# Patient Record
Sex: Female | Born: 1945 | Race: White | Hispanic: No | Smoking: Never smoker
Health system: Southern US, Community
[De-identification: ages and names within clinical notes are randomized; demographics above are authoritative.]

## PROBLEM LIST (undated history)

## (undated) DIAGNOSIS — L0390 Cellulitis, unspecified: Secondary | ICD-10-CM

## (undated) DIAGNOSIS — I2510 Atherosclerotic heart disease of native coronary artery without angina pectoris: Secondary | ICD-10-CM

## (undated) DIAGNOSIS — E119 Type 2 diabetes mellitus without complications: Secondary | ICD-10-CM

## (undated) DIAGNOSIS — G4733 Obstructive sleep apnea (adult) (pediatric): Secondary | ICD-10-CM

## (undated) DIAGNOSIS — I10 Essential (primary) hypertension: Secondary | ICD-10-CM

## (undated) DIAGNOSIS — C44509 Unspecified malignant neoplasm of skin of other part of trunk: Secondary | ICD-10-CM

## (undated) DIAGNOSIS — K649 Unspecified hemorrhoids: Secondary | ICD-10-CM

## (undated) DIAGNOSIS — K219 Gastro-esophageal reflux disease without esophagitis: Secondary | ICD-10-CM

## (undated) DIAGNOSIS — E785 Hyperlipidemia, unspecified: Secondary | ICD-10-CM

## (undated) HISTORY — DX: Atherosclerotic heart disease of native coronary artery without angina pectoris: I25.10

## (undated) HISTORY — DX: Essential (primary) hypertension: I10

## (undated) HISTORY — DX: Obstructive sleep apnea (adult) (pediatric): G47.33

## (undated) HISTORY — DX: Cellulitis, unspecified: L03.90

## (undated) HISTORY — DX: Hyperlipidemia, unspecified: E78.5

## (undated) HISTORY — DX: Morbid (severe) obesity due to excess calories: E66.01

## (undated) HISTORY — DX: Gastro-esophageal reflux disease without esophagitis: K21.9

## (undated) HISTORY — DX: Type 2 diabetes mellitus without complications: E11.9

---
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
## 1994-05-02 HISTORY — PX: LAPAROSCOPIC CHOLECYSTECTOMY: SUR755

## 2004-10-02 HISTORY — PX: CORONARY ANGIOPLASTY: SHX604

## 2004-10-02 HISTORY — PX: KNEE ARTHROSCOPY: SHX127

## 2004-11-21 ENCOUNTER — Ambulatory Visit: Admission: RE | Admit: 2004-11-21 | Discharge: 2004-11-21 | Payer: Self-pay | Admitting: Cardiology

## 2004-11-21 ENCOUNTER — Inpatient Hospital Stay (HOSPITAL_COMMUNITY): Admission: EM | Admit: 2004-11-21 | Discharge: 2004-11-25 | Payer: Self-pay | Admitting: *Deleted

## 2004-11-21 IMAGING — CR DG CHEST 1V PORT
1 series · 1 of 1 positions shown · non-contrast
Comparison: none

CLINICAL DATA: Chest pain. 
 CHEST PORTABLE 1 VIEW ? [DATE]: 
 AP view of the chest dated [DATE] is reviewed without prior films for comparison.   The heart is thought to be within the upper limits of normal in size.  The pulmonary vascularity is within normal limits and the lung fields are clear.

[view not recorded]
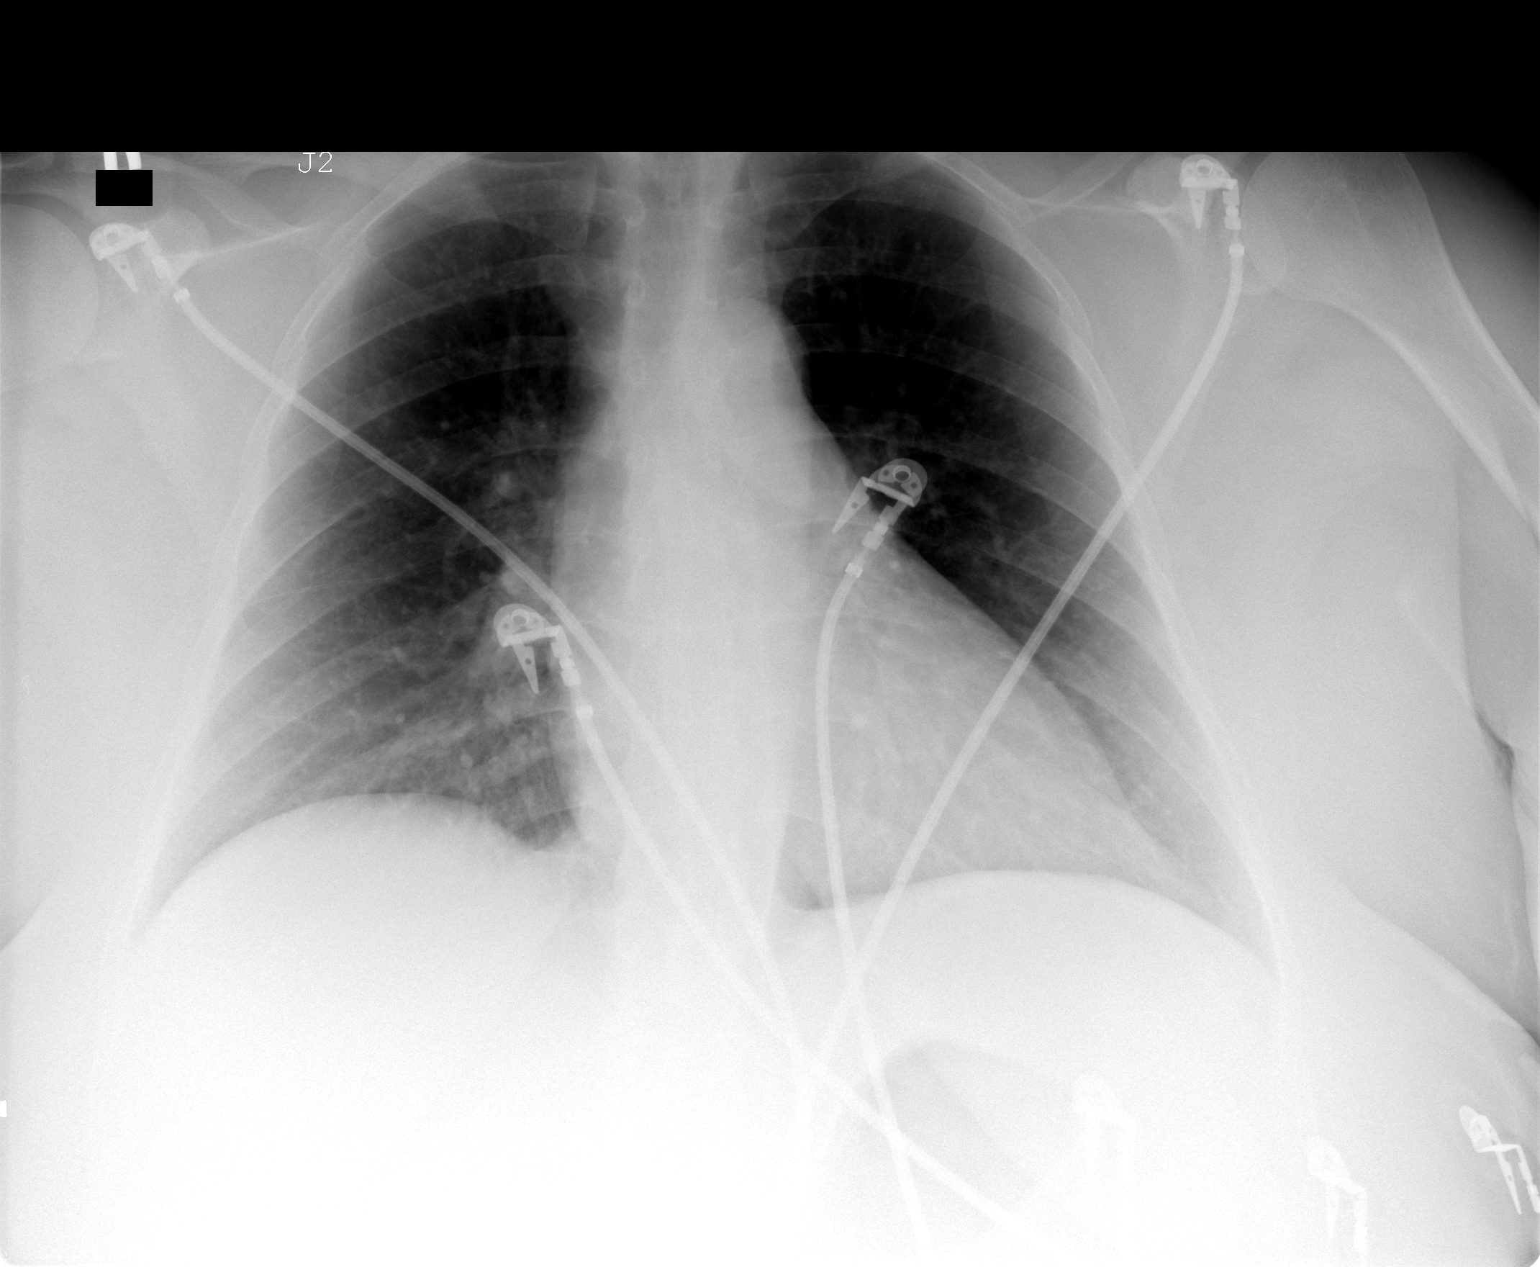

[1 of 1 positions shown; findings below may reference images not displayed]

IMPRESSION: Negative chest for active disease.

## 2004-12-12 ENCOUNTER — Encounter (HOSPITAL_COMMUNITY): Admission: RE | Admit: 2004-12-12 | Discharge: 2005-03-12 | Payer: Self-pay | Admitting: Cardiology

## 2006-01-19 ENCOUNTER — Ambulatory Visit (HOSPITAL_COMMUNITY): Admission: RE | Admit: 2006-01-19 | Discharge: 2006-01-19 | Payer: Self-pay | Admitting: Cardiology

## 2013-03-24 ENCOUNTER — Ambulatory Visit (INDEPENDENT_AMBULATORY_CARE_PROVIDER_SITE_OTHER): Payer: BC Managed Care – PPO | Admitting: Cardiovascular Disease

## 2013-03-24 ENCOUNTER — Encounter: Payer: Self-pay | Admitting: Cardiovascular Disease

## 2013-03-24 VITALS — BP 146/70 | HR 81 | Ht 64.0 in | Wt 284.0 lb

## 2013-03-24 DIAGNOSIS — I2510 Atherosclerotic heart disease of native coronary artery without angina pectoris: Secondary | ICD-10-CM | POA: Insufficient documentation

## 2013-03-24 DIAGNOSIS — I10 Essential (primary) hypertension: Secondary | ICD-10-CM | POA: Insufficient documentation

## 2013-03-24 DIAGNOSIS — G4733 Obstructive sleep apnea (adult) (pediatric): Secondary | ICD-10-CM | POA: Insufficient documentation

## 2013-03-24 DIAGNOSIS — E785 Hyperlipidemia, unspecified: Secondary | ICD-10-CM

## 2013-03-24 DIAGNOSIS — E119 Type 2 diabetes mellitus without complications: Secondary | ICD-10-CM

## 2013-03-24 NOTE — Assessment & Plan Note (Signed)
Status post LAD stenting by Dr. Lavonne Chick in 2006. She was recathed 4/07 revealing a patent stent but otherwise no significant CAD and normal LV function. Her last Myoview performed 09/15/09 was nonischemic. She gets rare chest pain.

## 2013-03-24 NOTE — Progress Notes (Signed)
03/24/2013 Deborah Barber   09-Dec-1945  [id number]  Primary Physician Deborah Spates, MD Primary Cardiologist: Deborah Gess MD Deborah Barber   HPI:  The patient is a 67 year old severely overweight divorced Caucasian female, mother of 1, grandmother to 2 grandchildren, who I last saw a year ago. She has a history of proximal LAD stenting by Dr. Lavonne Barber in 2006 with restudy April 2007 revealing a patent stent, and otherwise no significant CAD and normal LV function. Her risk factors include non-insulin-requiring diabetes, hypertension, hyperlipidemia, and a strong family history for heart disease with a father who had an MI at age 51 and multiple MIs thereafter, ultimately leading to his demise. She does have obstructive sleep apnea, on CPAP. She has atypical chest pain. Myoview performed September 15, 2009, was low risk. Recent lipid profile performed by Dr. Karleen Barber revealed a total cholesterol of 129, LDL of 41, HDL of 71. She continues to have atypical in frequent substernal chest pain.      Current Outpatient Prescriptions  Medication Sig Dispense Refill  . alendronate (FOSAMAX) 70 MG tablet Take 70 mg by mouth every 7 (seven) days.      [name] amLODipine-benazepril (LOTREL) 10-40 MG per capsule Take 1 capsule by mouth daily.       [name] aspirin 81 MG tablet Take 81 mg by mouth daily.      . cholecalciferol (VITAMIN D) 1000 UNITS tablet Take 1,000 Units by mouth. 2 tabs in the morning and 1 tab at night      . clopidogrel (PLAVIX) 75 MG tablet Take 75 mg by mouth daily.      . furosemide (LASIX) 20 MG tablet Take 20 mg by mouth as needed.      . metFORMIN (GLUCOPHAGE) 500 MG tablet Take 500 mg by mouth 2 (two) times daily.      . metoprolol succinate (TOPROL-XL) 100 MG 24 hr tablet Take 1 tablet by mouth daily.      . pantoprazole (PROTONIX) 40 MG tablet Take 40 mg by mouth daily.      . potassium chloride (K-DUR,KLOR-CON) 10 MEQ tablet Take 10 mEq by mouth as needed.      .  triamcinolone cream (KENALOG) 0.1 % Apply 1 application topically as needed.      [name] VYTORIN 10-40 MG per tablet Take 1 tablet by mouth daily.       No current facility-administered medications for this visit.    Allergies  Allergen Reactions  . Penicillins   . Sulfa Antibiotics     History   Social History  . Marital Status: Divorced    Spouse Name: N/A    Number of Children: N/A  . Years of Education: N/A   Occupational History  . Not on file.   Social History Main Topics  . Smoking status: Never Smoker   . Smokeless tobacco: Not on file  . Alcohol Use: No  . Drug Use: Not on file  . Sexually Active: Not on file   Other Topics Concern  . Not on file   Social History Narrative  . No narrative on file     Review of Systems: General: negative for chills, fever, night sweats or weight changes.  Cardiovascular: negative for chest pain, dyspnea on exertion, edema, orthopnea, palpitations, paroxysmal nocturnal dyspnea or shortness of breath Dermatological: negative for rash Respiratory: negative for cough or wheezing Urologic: negative for hematuria Abdominal: negative for nausea, vomiting, diarrhea, bright red blood per rectum, melena, or hematemesis Neurologic: negative  for visual changes, syncope, or dizziness All other systems reviewed and are otherwise negative except as noted above.    Blood pressure 146/70, pulse 81, height 5\' 4"  (1.626 m), weight 284 lb (128.822 kg).  General appearance: alert and no distress Neck: no adenopathy, no carotid bruit, no JVD, supple, symmetrical, trachea midline and thyroid not enlarged, symmetric, no tenderness/mass/nodules Lungs: clear to auscultation bilaterally Heart: regular rate and rhythm, S1, S2 normal, no murmur, click, rub or gallop Extremities: 2-3+ pitting bilateral lotion edema which is chronic probably related to venous stasis  EKG normal sinus rhythm at 81 without ST or T wave changes  ASSESSMENT AND PLAN:    Coronary artery disease Status post LAD stenting by Dr. Lavonne Barber in 2006. She was recathed 4/07 revealing a patent stent but otherwise no significant CAD and normal LV function. Her last Myoview performed 09/15/09 was nonischemic. She gets rare chest pain.  Hyperlipidemia Followed by her primary care physician on a statin drug      Deborah Gess MD [facility], [facility] 03/24/2013 10:43 AM

## 2013-03-24 NOTE — Assessment & Plan Note (Signed)
Followed by her primary care physician on a statin drug

## 2013-03-24 NOTE — Patient Instructions (Addendum)
Your physician wants you to follow-up in:  12 months.  You will receive a reminder letter in the mail two months in advance. If you don't receive a letter, please call our office to schedule the follow-up appointment.   

## 2014-01-05 ENCOUNTER — Encounter: Payer: Self-pay | Admitting: *Deleted

## 2014-01-06 ENCOUNTER — Ambulatory Visit (INDEPENDENT_AMBULATORY_CARE_PROVIDER_SITE_OTHER): Payer: BC Managed Care – PPO | Admitting: Cardiology

## 2014-01-06 ENCOUNTER — Encounter: Payer: Self-pay | Admitting: Cardiology

## 2014-01-06 VITALS — BP 142/74 | HR 81 | Ht 64.0 in | Wt 274.4 lb

## 2014-01-06 DIAGNOSIS — G4733 Obstructive sleep apnea (adult) (pediatric): Secondary | ICD-10-CM

## 2014-01-06 DIAGNOSIS — I10 Essential (primary) hypertension: Secondary | ICD-10-CM

## 2014-01-06 DIAGNOSIS — E119 Type 2 diabetes mellitus without complications: Secondary | ICD-10-CM

## 2014-01-06 DIAGNOSIS — E785 Hyperlipidemia, unspecified: Secondary | ICD-10-CM

## 2014-01-06 DIAGNOSIS — Z01810 Encounter for preprocedural cardiovascular examination: Secondary | ICD-10-CM

## 2014-01-06 DIAGNOSIS — Z01818 Encounter for other preprocedural examination: Secondary | ICD-10-CM

## 2014-01-06 DIAGNOSIS — I2510 Atherosclerotic heart disease of native coronary artery without angina pectoris: Secondary | ICD-10-CM

## 2014-01-06 NOTE — Progress Notes (Signed)
01/07/2014   PCP: Merian Capron, MD   Chief Complaint  Patient presents with  . Cardiac Clearance    Knee Surgery with Dr Hulen Luster yet scheduled.  Deborah Barber    C/o lightheadedness/dizziness-relates it to medication    Primary Cardiologist:  Dr. Gwenlyn Found  HPI:  68 year old severely overweight divorced Caucasian female, mother of 28, grandmother to 2 grandchildren, who is here for surgical clearance for Lt knee surgery. She has a history of proximal LAD stenting by Dr. Janene Madeira in 2006 with restudy April 2007 revealing a patent stent, and otherwise no significant CAD and normal LV function. Her risk factors include non-insulin-requiring diabetes, hypertension, hyperlipidemia, and a strong family history for heart disease with a father who had an MI at age 58 and multiple MIs thereafter, ultimately leading to his demise. She does have obstructive sleep apnea, on CPAP. She has atypical chest pain. Myoview performed September 15, 2009, was low risk. Recent lipid profile performed by Dr. Frederico Hamman revealed a total cholesterol of 129, LDL of 41, HDL of 71. She continues to have atypical in frequent substernal chest pain.  She also has some indigestion type discomfort after meals.  Recent lt knee pain, requiring pain meds and most likely knee surgery, was seen by Dr Aurea Graff PA and instructed to have cardiac clearance.  She has obvious pain in Lt knee in office.  With her strong family history and diabetes, hyperlipidemia we will proceed with lexiscan myoview to clear her.  It has been 5 years since last stress test.     Allergies  Allergen Reactions  . Penicillins   . Sulfa Antibiotics     Current Outpatient Prescriptions  Medication Sig Dispense Refill  . amLODipine-benazepril (LOTREL) 10-40 MG per capsule Take 1 capsule by mouth daily.       [name] aspirin 81 MG tablet Take 81 mg by mouth daily.      . cholecalciferol (VITAMIN D) 1000 UNITS tablet Take 1,000 Units by mouth. 2 tabs in  the morning and 1 tab at night      . furosemide (LASIX) 20 MG tablet Take 20 mg by mouth as needed.      [name] HYDROcodone-acetaminophen (NORCO/VICODIN) 5-325 MG per tablet Take 1 tablet by mouth every 6 (six) hours as needed.      . metFORMIN (GLUCOPHAGE) 500 MG tablet Take 500 mg by mouth 2 (two) times daily.      . metoprolol succinate (TOPROL-XL) 100 MG 24 hr tablet Take 1 tablet by mouth daily.      [name] NITROSTAT 0.4 MG SL tablet Place 1 tablet under the tongue every 5 (five) minutes x 3 doses as needed.      . ondansetron (ZOFRAN-ODT) 4 MG disintegrating tablet Take 1 tablet by mouth every 8 (eight) hours as needed.      . pantoprazole (PROTONIX) 40 MG tablet Take 40 mg by mouth daily.      . potassium chloride (K-DUR,KLOR-CON) 10 MEQ tablet Take 10 mEq by mouth as needed.      . triamcinolone cream (KENALOG) 0.1 % Apply 1 application topically as needed.      [name] VYTORIN 10-40 MG per tablet Take 1 tablet by mouth daily.      [name] alendronate (FOSAMAX) 70 MG tablet Take 70 mg by mouth every 7 (seven) days.      . clopidogrel (PLAVIX) 75 MG tablet Take 75 mg by mouth daily.  No current facility-administered medications for this visit.    Past Medical History  Diagnosis Date  . Coronary artery disease     status post LAD stenting by Dr. [name] in 2006  . Type 2 diabetes mellitus   . Hypertension   . Hyperlipidemia   . Obstructive sleep apnea     on CPAP  . Hx of echocardiogram 05/2009    was excellent with normal LV function   . History of stress test 08/201    was normal    Past Surgical History  Procedure Laterality Date  . Stents  2006    By Dr Janene Madeira revealing a patent stent, and otherwise no significant CAD and normal LV function.    TXM:IWOEHOZ:YY colds or fevers, no weight changes Skin:no rashes or ulcers HEENT:no blurred vision, no congestion CV:see HPI PUL:see HPI GI:no diarrhea constipation or melena, occ indigestion- after meals GU:no hematuria, no  dysuria MS:+ Lt knee joint pain needs surgery,  no claudication Neuro:no syncope, no lightheadedness Endo:+ diabetes followed by PCP HgbA1c 7.1, no thyroid disease  PHYSICAL EXAM BP 142/74  Pulse 81  Ht 5\' 4"  (1.626 m)  Wt 274 lb 6.4 oz (124.467 kg)  BMI 47.08 kg/m2 General:Pleasant affect, NAD Skin:Warm and dry, brisk capillary refill HEENT:normocephalic, sclera clear, mucus membranes moist Neck:supple, no JVD, no bruits, no adenopathy  Heart:S1S2 RRR without murmur, gallup, rub or click Lungs:clear without rales, rhonchi, or wheezes QMG:NOIBB, soft, non tender, + BS, do not palpate liver spleen or masses Ext:no lower ext edema, 2+ pedal pulses, 2+ radial pulses Neuro:alert and oriented, MAE, follows commands, + facial symmetry  EKG:SR, no acute changes.  ASSESSMENT AND PLAN Coronary artery disease History of proximal LAD stenting by Dr. Janene Madeira in 2006 with restudy April 2007 revealing a patent stent-- will proceed with lexiscan myoview, unable to use treadmill due to knee pain.  She has occ "indigestions" so we will rule out further CAD.    Encounter for pre-operative cardiovascular clearance lexiscan myoview, if negative we will clear for surgery, if positive will need follow up with Dr. Gwenlyn Found and poss. cath.  Essential hypertension controlled  Obstructive sleep apnea Using c pap  Hyperlipidemia Controlled and treated  Type 2 diabetes mellitus Followed by PCP

## 2014-01-06 NOTE — Patient Instructions (Signed)
We will schedule lexiscan myoview for surgical clearance for your knee surgery  EKG was good.  If Lexiscan stress test in normal we will clear for surgery, if not normal we will call and have you seen by Dr. Gwenlyn Found.  Otherwise see Dr. Gwenlyn Found in 6 months, we will count this as 6 month visit.

## 2014-01-07 ENCOUNTER — Encounter: Payer: Self-pay | Admitting: Cardiology

## 2014-01-07 DIAGNOSIS — Z01810 Encounter for preprocedural cardiovascular examination: Secondary | ICD-10-CM | POA: Insufficient documentation

## 2014-01-07 NOTE — Assessment & Plan Note (Signed)
Followed by PCP

## 2014-01-07 NOTE — Assessment & Plan Note (Signed)
Using cpap

## 2014-01-07 NOTE — Assessment & Plan Note (Signed)
controlled 

## 2014-01-07 NOTE — Assessment & Plan Note (Signed)
Controlled and treated

## 2014-01-07 NOTE — Assessment & Plan Note (Signed)
History of proximal LAD stenting by Dr. Janene Madeira in 2006 with restudy April 2007 revealing a patent stent-- will proceed with lexiscan myoview, unable to use treadmill due to knee pain.  She has occ "indigestions" so we will rule out further CAD.

## 2014-01-07 NOTE — Assessment & Plan Note (Signed)
lexiscan myoview, if negative we will clear for surgery, if positive will need follow up with Dr. Gwenlyn Found and poss. cath.

## 2014-01-08 ENCOUNTER — Telehealth (HOSPITAL_COMMUNITY): Payer: Self-pay

## 2014-01-12 ENCOUNTER — Telehealth: Payer: Self-pay | Admitting: *Deleted

## 2014-01-12 NOTE — Telephone Encounter (Signed)
Pt was calling in regards to her stress test. She is no longer having her surgery and she wanted to know if she still needed to have this test as part of her yearly appointment.  JB

## 2014-01-12 NOTE — Telephone Encounter (Signed)
I spoke with patient. They are postponing the knee surgery for about 8 months to give her time to lower her BMI. I will review with Dr Gwenlyn Found and let her know.

## 2014-01-12 NOTE — Telephone Encounter (Signed)
Message forwarded to Curt Bears, RN to discuss w/ Dr. Gwenlyn Found.   Lexiscan was ordered for clearance for knee surgery and pt had not had one in 5 years.

## 2014-01-13 NOTE — Telephone Encounter (Signed)
Per Dr Gwenlyn Found, lets hold off on the myoview until closer to the surgery. Patient notified. She will contact us if she wants to proceed with any surgery in the future.

## 2014-01-14 ENCOUNTER — Encounter (HOSPITAL_COMMUNITY): Payer: BC Managed Care – PPO

## 2014-04-02 NOTE — Telephone Encounter (Signed)
Encounter complete. 

## 2014-04-09 NOTE — Telephone Encounter (Signed)
Encounter complete. 

## 2014-07-07 ENCOUNTER — Ambulatory Visit (INDEPENDENT_AMBULATORY_CARE_PROVIDER_SITE_OTHER): Payer: BC Managed Care – PPO | Admitting: Cardiovascular Disease

## 2014-07-07 ENCOUNTER — Encounter: Payer: Self-pay | Admitting: Cardiovascular Disease

## 2014-07-07 VITALS — BP 162/86 | HR 87 | Ht 64.0 in | Wt 288.0 lb

## 2014-07-07 DIAGNOSIS — I10 Essential (primary) hypertension: Secondary | ICD-10-CM

## 2014-07-07 DIAGNOSIS — E785 Hyperlipidemia, unspecified: Secondary | ICD-10-CM

## 2014-07-07 DIAGNOSIS — G4733 Obstructive sleep apnea (adult) (pediatric): Secondary | ICD-10-CM

## 2014-07-07 DIAGNOSIS — I2510 Atherosclerotic heart disease of native coronary artery without angina pectoris: Secondary | ICD-10-CM

## 2014-07-07 NOTE — Progress Notes (Signed)
07/07/2014 Deborah Barber   07/09/1946  [id number]  Primary Physician Merian Capron, MD Primary Cardiologist: [name] MD [name]   HPI:  The patient is a 68 year old severely overweight divorced Caucasian female, mother of 24, grandmother to 2 grandchildren, who I last saw 17 months ago. She did see [name] registered nurse practitioner in the office/8/15.She has a history of proximal LAD stenting by Dr. Janene Madeira in 2006 with restudy April 2007 revealing a patent stent, and otherwise no significant CAD and normal LV function. Her risk factors include non-insulin-requiring diabetes, hypertension, hyperlipidemia, and a strong family history for heart disease with a father who had an MI at age 80 and multiple MIs thereafter, ultimately leading to his demise. She does have obstructive sleep apnea, on CPAP. She has atypical chest pain. Myoview performed September 15, 2009, was low risk. Recent lipid profile performed by Dr. Frederico Hamman revealed a total cholesterol of 129, LDL of 41, HDL of 71. She continues to have atypical in frequent substernal chest pain.     Current Outpatient Prescriptions  Medication Sig Dispense Refill  . alendronate (FOSAMAX) 70 MG tablet Take 70 mg by mouth every 7 (seven) days.      [name] amLODipine-benazepril (LOTREL) 10-40 MG per capsule Take 1 capsule by mouth daily.       [name] aspirin 81 MG tablet Take 81 mg by mouth daily.      . cholecalciferol (VITAMIN D) 1000 UNITS tablet Take 1,000 Units by mouth. 2 tabs in the morning and 1 tab at night      . clopidogrel (PLAVIX) 75 MG tablet Take 75 mg by mouth daily.      . furosemide (LASIX) 20 MG tablet Take 20 mg by mouth as needed.      [name] HYDROcodone-acetaminophen (NORCO/VICODIN) 5-325 MG per tablet Take 1 tablet by mouth every 6 (six) hours as needed.      . meloxicam (MOBIC) 15 MG tablet Take 15 mg by mouth daily.      . metFORMIN (GLUCOPHAGE) 500 MG tablet Take 500 mg by mouth 2 (two) times  daily.      . metoprolol succinate (TOPROL-XL) 100 MG 24 hr tablet Take 1 tablet by mouth daily.      [name] NITROSTAT 0.4 MG SL tablet Place 1 tablet under the tongue every 5 (five) minutes x 3 doses as needed.      . pantoprazole (PROTONIX) 40 MG tablet Take 40 mg by mouth daily.      . potassium chloride (K-DUR,KLOR-CON) 10 MEQ tablet Take 10 mEq by mouth as needed.      [name] VYTORIN 10-40 MG per tablet Take 1 tablet by mouth daily.       No current facility-administered medications for this visit.    Allergies  Allergen Reactions  . Oxycodone-Aspirin Nausea Only and Swelling    Other reaction(s): Vertigo  . Penicillins   . Sulfa Antibiotics     History   Social History  . Marital Status: Divorced    Spouse Name: N/A    Number of Children: N/A  . Years of Education: N/A   Occupational History  . Not on file.   Social History Main Topics  . Smoking status: Never Smoker   . Smokeless tobacco: Not on file  . Alcohol Use: No  . Drug Use: Not on file  . Sexual Activity: Not on file   Other Topics Concern  . Not on file   Social History  Narrative  . No narrative on file     Review of Systems: General: negative for chills, fever, night sweats or weight changes.  Cardiovascular: negative for chest pain, dyspnea on exertion, edema, orthopnea, palpitations, paroxysmal nocturnal dyspnea or shortness of breath Dermatological: negative for rash Respiratory: negative for cough or wheezing Urologic: negative for hematuria Abdominal: negative for nausea, vomiting, diarrhea, bright red blood per rectum, melena, or hematemesis Neurologic: negative for visual changes, syncope, or dizziness All other systems reviewed and are otherwise negative except as noted above.    Blood pressure 162/86, pulse 87, height 5\' 4"  (1.626 m), weight 288 lb (130.636 kg).  General appearance: alert and no distress Neck: no adenopathy, no carotid bruit, no JVD, supple, symmetrical, trachea midline and  thyroid not enlarged, symmetric, no tenderness/mass/nodules Lungs: clear to auscultation bilaterally Heart: regular rate and rhythm, S1, S2 normal, no murmur, click, rub or gallop Extremities: extremities normal, atraumatic, no cyanosis or edema  EKG normal sinus rhythm at 87 with nonspecific ST and T-wave changes  ASSESSMENT AND PLAN:   Coronary artery disease History of CAD status post LAD stenting by Dr. Janene Madeira in 2006 with restudy April 2007 revealing a widely patent stent and otherwise no significant CAD with normal LV function. She did have a negative Myoview stress test 09/05/09. She had typical night responsive angina approximately one month ago in 2 consecutive evenings but none since. If she continues to have chest pain she will need a followup pharmacologic Myoview stress test.  Essential hypertension Controlled on current medications  Hyperlipidemia On statin therapy followed by her PCP, Dr. Merian Capron.  Obstructive sleep apnea On CPAP which she wears intermittently      [name] MD [facility], [facility] 07/07/2014 8:59 AM

## 2014-07-07 NOTE — Assessment & Plan Note (Signed)
History of CAD status post LAD stenting by Dr. Janene Madeira in 2006 with restudy April 2007 revealing a widely patent stent and otherwise no significant CAD with normal LV function. She did have a negative Myoview stress test 09/05/09. She had typical night responsive angina approximately one month ago in 2 consecutive evenings but none since. If she continues to have chest pain she will need a followup pharmacologic Myoview stress test.

## 2014-07-07 NOTE — Assessment & Plan Note (Addendum)
On CPAP which she wears intermittently

## 2014-07-07 NOTE — Assessment & Plan Note (Signed)
Controlled on current medications 

## 2014-07-07 NOTE — Assessment & Plan Note (Signed)
On statin therapy followed by her PCP, Dr. Merian Capron.

## 2014-07-07 NOTE — Patient Instructions (Signed)
Dr Gwenlyn Found recommends that you schedule a follow-up appointment in 6 months with an extender - [name], NP.  Dr Gwenlyn Found wants you to follow-up in 12 months. You will receive a reminder letter in the mail two months in advance. If you don't receive a letter, please call our office to schedule the follow-up appointment.

## 2015-01-13 ENCOUNTER — Ambulatory Visit (INDEPENDENT_AMBULATORY_CARE_PROVIDER_SITE_OTHER): Payer: BLUE CROSS/BLUE SHIELD | Admitting: Cardiology

## 2015-01-13 ENCOUNTER — Encounter: Payer: Self-pay | Admitting: Cardiology

## 2015-01-13 ENCOUNTER — Other Ambulatory Visit (HOSPITAL_COMMUNITY): Payer: Self-pay | Admitting: Cardiovascular Disease

## 2015-01-13 ENCOUNTER — Telehealth: Payer: Self-pay | Admitting: Cardiology

## 2015-01-13 VITALS — BP 180/68 | HR 72 | Ht 64.0 in | Wt 298.6 lb

## 2015-01-13 DIAGNOSIS — I208 Other forms of angina pectoris: Secondary | ICD-10-CM

## 2015-01-13 DIAGNOSIS — R079 Chest pain, unspecified: Secondary | ICD-10-CM | POA: Diagnosis not present

## 2015-01-13 DIAGNOSIS — Z79899 Other long term (current) drug therapy: Secondary | ICD-10-CM

## 2015-01-13 MED ORDER — FUROSEMIDE 20 MG PO TABS: 20.0000 mg | ORAL_TABLET | Freq: Every day | ORAL | Status: DC

## 2015-01-13 MED ORDER — HYDROCHLOROTHIAZIDE 25 MG PO TABS: 25.0000 mg | ORAL_TABLET | Freq: Every day | ORAL | Status: DC

## 2015-01-13 MED ORDER — POTASSIUM CHLORIDE ER 10 MEQ PO TBCR: 10.0000 meq | EXTENDED_RELEASE_TABLET | Freq: Every day | ORAL | Status: AC

## 2015-01-13 NOTE — Patient Instructions (Signed)
START HCTZ 25mg  daily.  Your physician recommends that you return for lab work in: One week.  Your physician has requested that you have a lexiscan myoview. For further information please visit [email or web address]. Please follow instruction sheet, as given.  Your physician recommends that you schedule a follow-up appointment in: 4-5 weeks with [name], NP.

## 2015-01-13 NOTE — Progress Notes (Signed)
Cardiology Office Note   Date:  01/13/2015   ID:  Deborah Barber, DOB Jun 17, 1946, MRN [id number]  PCP:  Merian Capron, MD  Cardiologist:  Dr. Gwenlyn Found    Chief Complaint  Patient presents with  . Coronary Artery Disease    6 months:  Had 2 episodes of chest discomfort that radiated up into her jaws requiring 2 NTG about 4 weeks ago while sitting on the couch .  Each episode required 2 NTG.      History of Present Illness: Deborah Barber is a 69 y.o. female who presents for CAD follow up. She has a history of proximal LAD stenting by Dr. Janene Madeira in 2006 with restudy April 2007 revealing a patent stent, and otherwise no significant CAD and normal LV function. Her risk factors include non-insulin-requiring diabetes, hypertension, hyperlipidemia, and a strong family history for heart disease with a father who had an MI at age 2 and multiple MIs thereafter, ultimately leading to his demise. She does have obstructive sleep apnea, on CPAP.  Myoview performed September 15, 2009, was low risk. Recent lipid profile performed by Dr. Frederico Hamman revealed a total cholesterol of 129, LDL of 41, HDL of 71. Today her BP is elevated.  She tells me it was up on another office visit.  She has not taken lasix in over 1 year.  She has had 2 episodes of jaw pain in last 5 weeks, that resolved with NTG. No associated symptoms.  When she walks with groceries or anything heavy she develops chest pressure that resolves with rest.    She also was told she cannot have knee surgery until she has gastric bypass.  She has gone to the seminar on the gastric bypass.         Past Medical History  Diagnosis Date  . Coronary artery disease     status post LAD stenting by Dr. [name] in 2006  . Type 2 diabetes mellitus   . Hypertension   . Hyperlipidemia   . Obstructive sleep apnea     on CPAP  . Hx of echocardiogram 05/2009    was excellent with normal LV function   . History of stress test 08/201    was normal  .  Morbid obesity     contemplating bariatric surgery    Past Surgical History  Procedure Laterality Date  . Stents  2006    By Dr Janene Madeira revealing a patent stent, and otherwise no significant CAD and normal LV function.     Current Outpatient Prescriptions  Medication Sig Dispense Refill  . alendronate (FOSAMAX) 70 MG tablet Take 70 mg by mouth every 7 (seven) days.    [name] amLODipine-benazepril (LOTREL) 10-40 MG per capsule Take 1 capsule by mouth daily.     [name] aspirin 81 MG tablet Take 81 mg by mouth daily.    . cholecalciferol (VITAMIN D) 1000 UNITS tablet Take 1,000 Units by mouth. 2 tabs in the morning and 1 tab at night    . clopidogrel (PLAVIX) 75 MG tablet Take 75 mg by mouth daily.    . furosemide (LASIX) 20 MG tablet Take 20 mg by mouth as needed.    . meloxicam (MOBIC) 15 MG tablet Take 15 mg by mouth daily.    . metFORMIN (GLUCOPHAGE) 500 MG tablet Take 500 mg by mouth 2 (two) times daily.    . metoprolol succinate (TOPROL-XL) 100 MG 24 hr tablet Take 1 tablet by mouth daily.    [name] NITROSTAT  0.4 MG SL tablet Place 1 tablet under the tongue every 5 (five) minutes x 3 doses as needed.    . pantoprazole (PROTONIX) 40 MG tablet Take 40 mg by mouth daily.    . potassium chloride (K-DUR,KLOR-CON) 10 MEQ tablet Take 10 mEq by mouth as needed.    [name] VYTORIN 10-40 MG per tablet Take 1 tablet by mouth daily.     No current facility-administered medications for this visit.    Allergies:   Oxycodone-aspirin; Penicillins; and Sulfa antibiotics    Social History:  The patient  reports that she has never smoked. She does not have any smokeless tobacco history on file. She reports that she does not drink alcohol.   Family History:  The patient's family history includes Cancer - Lung in her maternal grandfather; Dementia in her mother; Heart attack in her father, paternal grandfather, and paternal grandmother; Hypertension in her maternal grandmother.    ROS:  General:no colds or  fevers, + weight increase Skin:no rashes or ulcers HEENT:no blurred vision, no congestion CV:see HPI PUL:see HPI GI:no diarrhea constipation or melena, no indigestion GU:no hematuria, no dysuria MS:+ joint pain- lt knee and now rt ankle injured when used to compensate for lt knee., no claudication Neuro:no syncope, no lightheadedness Endo:+ diabetes stable "but it could be better", no thyroid disease She will send most recent copy of labs on chol.  Wt Readings from Last 3 Encounters:  01/13/15 298 lb 9.6 oz (135.444 kg)  07/07/14 288 lb (130.636 kg)  01/06/14 274 lb 6.4 oz (124.467 kg)     PHYSICAL EXAM: VS:  BP 180/68 mmHg  Pulse 72  Ht 5\' 4"  (1.626 m)  Wt 298 lb 9.6 oz (135.444 kg)  BMI 51.23 kg/m2 , BMI Body mass index is 51.23 kg/(m^2). General:Pleasant affect, NAD Skin:Warm and dry, brisk capillary refill HEENT:normocephalic, sclera clear, mucus membranes moist Neck:supple, no JVD, no bruits  Heart:S1S2 RRR without murmur, gallup, rub or click Lungs:clear without rales, rhonchi, or wheezes RKY:HCWCB, soft, non tender, + BS, do not palpate liver spleen or masses Ext:!+ lower ext edema, 2+ pedal pulses, 2+ radial pulses Neuro:alert and oriented X 3, MAE, follows commands, + facial symmetry    EKG:  EKG is ordered today. The ekg ordered today demonstrates SR rate of 72 no acute changes from 07/2014.   Recent Labs: No results found for requested labs within last 365 days.    Lipid Panel No results found for: CHOL, TRIG, HDL, CHOLHDL, VLDL, LDLCALC, LDLDIRECT     Other studies Reviewed: Additional studies/ records that were reviewed today include: previous notes labs.   ASSESSMENT AND PLAN:  Coronary artery disease History of CAD status post LAD stenting by Dr. Janene Madeira in 2006 with restudy April 2007 revealing a widely patent stent and otherwise no significant CAD with normal LV function. She did have a negative Myoview stress test 09/05/09. 2 episodes of  jaw pain and 2 NTG each time with relief.  Also with carrying heavy objects she develops chest heaviness, resolves with rest.  With angina in her hx and her diabetes, and possible need for gastric bypass we will proceed with lexiscan myoview.  She cannot have treadmill due to knee and anlke pain.  She will follow up in 4-6 weeks unless + stress test. Then will coordinate with Dr. Adora Fridge.  Essential hypertension Elevated on current medications- will add HCTZ 25 mg daily and check BMP in 1 week.  Hyperlipidemia On statin therapy followed by her  PCP, Dr. Merian Capron.  Obstructive sleep apnea On CPAP which she wears intermittently     Current medicines are reviewed with the patient today.  The patient Has no concerns regarding medicines.  The following changes have been made:  See above Labs/ tests ordered today include:see above  Disposition:   FU:  see above  Lennie Muckle, NP  01/13/2015 10:36 AM    [facility] [place], [place], [place], [place] Phone: [phone]; Fax: [phone]

## 2015-01-13 NOTE — Telephone Encounter (Signed)
The pharmacy has her listed as having thiazide allergy and the prescription is for HCTZ.[name] Please call    Thanks

## 2015-01-13 NOTE — Telephone Encounter (Signed)
Received a call from [facility] in [place] stating patient allergic to thiazides.Spoke to [name] NP she advised to take lasix 20 mg daily and kdur 10 meq daily for blood pressure.Patient was called and notified.

## 2015-01-19 ENCOUNTER — Telehealth (HOSPITAL_COMMUNITY): Payer: Self-pay

## 2015-01-19 ENCOUNTER — Encounter (HOSPITAL_COMMUNITY): Payer: BLUE CROSS/BLUE SHIELD

## 2015-01-19 NOTE — Telephone Encounter (Signed)
Encounter complete. 

## 2015-01-20 ENCOUNTER — Telehealth (HOSPITAL_COMMUNITY): Payer: Self-pay

## 2015-01-20 NOTE — Telephone Encounter (Signed)
Encounter complete. 

## 2015-01-21 ENCOUNTER — Ambulatory Visit (HOSPITAL_COMMUNITY)
Admission: RE | Admit: 2015-01-21 | Discharge: 2015-01-21 | Disposition: A | Discharge status: Home / Self Care | Payer: BLUE CROSS/BLUE SHIELD | Source: Ambulatory Visit | Attending: Cardiology | Admitting: Cardiology

## 2015-01-21 ENCOUNTER — Encounter: Payer: Self-pay | Admitting: Cardiology

## 2015-01-21 DIAGNOSIS — I208 Other forms of angina pectoris: Secondary | ICD-10-CM | POA: Diagnosis not present

## 2015-01-21 DIAGNOSIS — R079 Chest pain, unspecified: Secondary | ICD-10-CM | POA: Diagnosis not present

## 2015-01-21 MED ORDER — AMINOPHYLLINE 25 MG/ML IV SOLN
100.0000 mg | Freq: Once | INTRAVENOUS | Status: AC
  Administered 2015-01-21: 100 mg via INTRAVENOUS

## 2015-01-21 MED ORDER — TECHNETIUM TC 99M SESTAMIBI GENERIC - CARDIOLITE
31.0000 | Freq: Once | INTRAVENOUS | Status: AC | PRN
  Administered 2015-01-21: 31 via INTRAVENOUS

## 2015-01-21 MED ORDER — REGADENOSON 0.4 MG/5ML IV SOLN
0.4000 mg | Freq: Once | INTRAVENOUS | Status: AC
  Administered 2015-01-21: 0.4 mg via INTRAVENOUS

## 2015-01-21 NOTE — Procedures (Addendum)
[facility] [address] [place] [address] [phone]  Cardiology Nuclear Med Study  Sapna Padron is a 69 y.o. female     MRN : [id number]     DOB: [birth year]  Procedure Date: 01/21/2015  Nuclear Med Background Indication for Stress Test:  Surgical Clearance History:  CAD;MI;STENT/PTCA-2006-LAD;Last NUC MPI on 08/112010;EF=75% Cardiac Risk Factors: Family History - CAD, Hypertension, Lipids, NIDDM and Obesity  Symptoms:  Chest Pain, DOE, Fatigue and Light-Headedness   Nuclear Pre-Procedure Caffeine/Decaff Intake:  7:00pm NPO After: 3:00am   IV Site: R Forearm  IV 0.9% NS with Angio Cath:  22g  Chest Size (in):  n/a IV Started by: Larene Beach, RN  Height: 5\' 4"  (1.626 m)  Cup Size: D  BMI:  Body mass index is 51.13 kg/(m^2). Weight:  298 lb (135.172 kg)   Tech Comments:  n/a    Nuclear Med Study 1 or 2 day study: 2 day  Stress Test Type:  [place] Provider:  Quay Burow, MD   Resting Radionuclide: Technetium 71m Sestamibi  Resting Radionuclide Dose: 32.1 mCi   Stress Radionuclide:  Technetium 56m Sestamibi  Stress Radionuclide Dose: 31.0 mCi           Stress Protocol Rest HR: 68 Stress HR: 83  Rest BP: 147/77 Stress BP: 158/59  Exercise Time (min): n/a METS: n/a          Dose of Adenosine (mg):  n/a Dose of Lexiscan: 0.4 mg  Dose of Atropine (mg): n/a Dose of Dobutamine: n/a mcg/kg/min (at max HR)  Stress Test Technologist: Leane Para, CCT Nuclear Technologist: Imagene Riches, CNMT   Rest Procedure:  Myocardial perfusion imaging was performed at rest 45 minutes following the intravenous administration of Technetium 29m Sestamibi. Stress Procedure:  The patient received IV Lexiscan 0.4 mg over 15-seconds.  Technetium 73m Sestamibi injected IV at 30-seconds.  Patient experienced brief SOB, Nausea and odd sensation in head and 100 mg Aminophylline IV was administered.  There were  no significant changes with Lexiscan.  Quantitative spect images were obtained after a 45 minute delay.  Transient Ischemic Dilatation (Normal <1.22):  1.09  QGS EDV:  91 ml QGS ESV:  25 ml LV Ejection Fraction: 72%       Rest ECG: NSR - Normal EKG  Stress ECG: No significant change from baseline ECG  QPS Raw Data Images:  Normal; no motion artifact; normal heart/lung ratio. Stress Images:  Normal homogeneous uptake in all areas of the myocardium. Rest Images:  Normal homogeneous uptake in all areas of the myocardium. Subtraction (SDS):  No evidence of ischemia.  Impression Exercise Capacity:  Lexiscan with no exercise. BP Response:  Normal blood pressure response. Clinical Symptoms:  No significant symptoms noted. ECG Impression:  No significant ST segment change suggestive of ischemia. Comparison with Prior Nuclear Study: No significant change from previous study  Overall Impression:  Normal stress nuclear study.  LV Wall Motion:  NL LV Function; NL Wall Motion   [name], MD  01/22/2015 1:42 PM

## 2015-01-22 ENCOUNTER — Encounter: Payer: Self-pay | Admitting: *Deleted

## 2015-01-22 ENCOUNTER — Ambulatory Visit (HOSPITAL_COMMUNITY)
Admission: RE | Admit: 2015-01-22 | Discharge: 2015-01-22 | Disposition: A | Discharge status: Home / Self Care | Payer: BLUE CROSS/BLUE SHIELD | Source: Ambulatory Visit | Attending: Cardiology | Admitting: Cardiology

## 2015-01-22 DIAGNOSIS — R079 Chest pain, unspecified: Secondary | ICD-10-CM | POA: Insufficient documentation

## 2015-01-22 DIAGNOSIS — E669 Obesity, unspecified: Secondary | ICD-10-CM | POA: Insufficient documentation

## 2015-01-22 DIAGNOSIS — Z6841 Body Mass Index (BMI) 40.0 and over, adult: Secondary | ICD-10-CM | POA: Insufficient documentation

## 2015-01-22 DIAGNOSIS — I10 Essential (primary) hypertension: Secondary | ICD-10-CM | POA: Insufficient documentation

## 2015-01-22 DIAGNOSIS — I208 Other forms of angina pectoris: Secondary | ICD-10-CM

## 2015-01-22 DIAGNOSIS — I2510 Atherosclerotic heart disease of native coronary artery without angina pectoris: Secondary | ICD-10-CM | POA: Insufficient documentation

## 2015-01-22 DIAGNOSIS — I252 Old myocardial infarction: Secondary | ICD-10-CM | POA: Diagnosis not present

## 2015-01-22 DIAGNOSIS — Z01810 Encounter for preprocedural cardiovascular examination: Secondary | ICD-10-CM | POA: Insufficient documentation

## 2015-01-22 DIAGNOSIS — Z8249 Family history of ischemic heart disease and other diseases of the circulatory system: Secondary | ICD-10-CM | POA: Diagnosis not present

## 2015-01-22 DIAGNOSIS — E119 Type 2 diabetes mellitus without complications: Secondary | ICD-10-CM | POA: Diagnosis not present

## 2015-01-22 DIAGNOSIS — Z955 Presence of coronary angioplasty implant and graft: Secondary | ICD-10-CM | POA: Insufficient documentation

## 2015-01-22 MED ORDER — TECHNETIUM TC 99M SESTAMIBI GENERIC - CARDIOLITE
32.1000 | Freq: Once | INTRAVENOUS | Status: AC | PRN
  Administered 2015-01-22: 32.1 via INTRAVENOUS

## 2015-02-15 ENCOUNTER — Ambulatory Visit: Payer: BLUE CROSS/BLUE SHIELD | Admitting: Cardiology

## 2015-04-28 HISTORY — PX: CATARACT EXTRACTION W/ INTRAOCULAR LENS IMPLANT: SHX1309

## 2015-05-06 ENCOUNTER — Emergency Department (HOSPITAL_COMMUNITY): Payer: BLUE CROSS/BLUE SHIELD

## 2015-05-06 ENCOUNTER — Observation Stay (HOSPITAL_COMMUNITY)
Admission: EM | Admit: 2015-05-06 | Discharge: 2015-05-07 | Disposition: A | Discharge status: Home / Self Care | Payer: BLUE CROSS/BLUE SHIELD | Attending: Cardiology | Admitting: Cardiology

## 2015-05-06 ENCOUNTER — Encounter (HOSPITAL_COMMUNITY): Payer: Self-pay | Admitting: *Deleted

## 2015-05-06 ENCOUNTER — Telehealth: Payer: Self-pay | Admitting: Cardiovascular Disease

## 2015-05-06 DIAGNOSIS — I25110 Atherosclerotic heart disease of native coronary artery with unstable angina pectoris: Principal | ICD-10-CM | POA: Insufficient documentation

## 2015-05-06 DIAGNOSIS — I252 Old myocardial infarction: Secondary | ICD-10-CM | POA: Insufficient documentation

## 2015-05-06 DIAGNOSIS — Z7982 Long term (current) use of aspirin: Secondary | ICD-10-CM | POA: Insufficient documentation

## 2015-05-06 DIAGNOSIS — E119 Type 2 diabetes mellitus without complications: Secondary | ICD-10-CM

## 2015-05-06 DIAGNOSIS — Z7902 Long term (current) use of antithrombotics/antiplatelets: Secondary | ICD-10-CM | POA: Diagnosis not present

## 2015-05-06 DIAGNOSIS — Z791 Long term (current) use of non-steroidal anti-inflammatories (NSAID): Secondary | ICD-10-CM | POA: Diagnosis not present

## 2015-05-06 DIAGNOSIS — Z955 Presence of coronary angioplasty implant and graft: Secondary | ICD-10-CM | POA: Insufficient documentation

## 2015-05-06 DIAGNOSIS — E785 Hyperlipidemia, unspecified: Secondary | ICD-10-CM

## 2015-05-06 DIAGNOSIS — I10 Essential (primary) hypertension: Secondary | ICD-10-CM | POA: Diagnosis not present

## 2015-05-06 DIAGNOSIS — Z6841 Body Mass Index (BMI) 40.0 and over, adult: Secondary | ICD-10-CM | POA: Diagnosis not present

## 2015-05-06 DIAGNOSIS — Z79899 Other long term (current) drug therapy: Secondary | ICD-10-CM | POA: Diagnosis not present

## 2015-05-06 DIAGNOSIS — R0789 Other chest pain: Secondary | ICD-10-CM | POA: Diagnosis present

## 2015-05-06 DIAGNOSIS — I200 Unstable angina: Secondary | ICD-10-CM | POA: Diagnosis present

## 2015-05-06 DIAGNOSIS — I2510 Atherosclerotic heart disease of native coronary artery without angina pectoris: Secondary | ICD-10-CM | POA: Diagnosis not present

## 2015-05-06 DIAGNOSIS — G4733 Obstructive sleep apnea (adult) (pediatric): Secondary | ICD-10-CM

## 2015-05-06 DIAGNOSIS — R079 Chest pain, unspecified: Secondary | ICD-10-CM

## 2015-05-06 HISTORY — DX: Unspecified hemorrhoids: K64.9

## 2015-05-06 HISTORY — DX: Unspecified malignant neoplasm of skin of other part of trunk: C44.509

## 2015-05-06 LAB — CREATININE, SERUM
CREATININE: 1.15 mg/dL — AB (ref 0.44–1.00)
GFR calc Af Amer: 55 mL/min — ABNORMAL LOW (ref >60)
GFR, EST NON AFRICAN AMERICAN: 48 mL/min — AB (ref >60)

## 2015-05-06 LAB — PROTIME-INR
INR: 1.13 (ref 0.00–1.49)
Prothrombin Time: 14.7 seconds (ref 11.6–15.2)

## 2015-05-06 LAB — BASIC METABOLIC PANEL
ANION GAP: 8 (ref 5–15)
BUN: 14 mg/dL (ref 6–20)
CALCIUM: 9.6 mg/dL (ref 8.9–10.3)
CHLORIDE: 106 mmol/L (ref 101–111)
CO2: 25 mmol/L (ref 22–32)
CREATININE: 1.09 mg/dL — AB (ref 0.44–1.00)
GFR calc Af Amer: 59 mL/min — ABNORMAL LOW (ref >60)
GFR, EST NON AFRICAN AMERICAN: 51 mL/min — AB (ref >60)
Glucose, Bld: 142 mg/dL — ABNORMAL HIGH (ref 65–99)
Potassium: 3.9 mmol/L (ref 3.5–5.1)
Sodium: 139 mmol/L (ref 135–145)

## 2015-05-06 LAB — HEPATIC FUNCTION PANEL
ALBUMIN: 3.5 g/dL (ref 3.5–5.0)
ALT: 16 U/L (ref 14–54)
AST: 21 U/L (ref 15–41)
Alkaline Phosphatase: 65 U/L (ref 38–126)
BILIRUBIN DIRECT: 0.2 mg/dL (ref 0.1–0.5)
Indirect Bilirubin: 0.4 mg/dL (ref 0.3–0.9)
Total Bilirubin: 0.6 mg/dL (ref 0.3–1.2)
Total Protein: 6.4 g/dL — ABNORMAL LOW (ref 6.5–8.1)

## 2015-05-06 LAB — CBC
HCT: 39.4 % (ref 36.0–46.0)
HEMATOCRIT: 38.6 % (ref 36.0–46.0)
HEMOGLOBIN: 12.6 g/dL (ref 12.0–15.0)
Hemoglobin: 12.2 g/dL (ref 12.0–15.0)
MCH: 26.4 pg (ref 26.0–34.0)
MCH: 25.8 pg — ABNORMAL LOW (ref 26.0–34.0)
MCHC: 32 g/dL (ref 30.0–36.0)
MCHC: 31.6 g/dL (ref 30.0–36.0)
MCV: 82.4 fL (ref 78.0–100.0)
MCV: 81.8 fL (ref 78.0–100.0)
Platelets: 255 10*3/uL (ref 150–400)
Platelets: 276 10*3/uL (ref 150–400)
RBC: 4.78 MIL/uL (ref 3.87–5.11)
RBC: 4.72 MIL/uL (ref 3.87–5.11)
RDW: 15 % (ref 11.5–15.5)
RDW: 14.9 % (ref 11.5–15.5)
WBC: 9.5 10*3/uL (ref 4.0–10.5)
WBC: 9.1 10*3/uL (ref 4.0–10.5)

## 2015-05-06 LAB — I-STAT TROPONIN, ED: Troponin i, poc: 0.01 ng/mL (ref 0.00–0.08)

## 2015-05-06 LAB — GLUCOSE, CAPILLARY: Glucose-Capillary: 170 mg/dL — ABNORMAL HIGH (ref 65–99)

## 2015-05-06 LAB — TSH: TSH: 0.668 u[IU]/mL (ref 0.350–4.500)

## 2015-05-06 LAB — BRAIN NATRIURETIC PEPTIDE: B NATRIURETIC PEPTIDE 5: 63.2 pg/mL (ref 0.0–100.0)

## 2015-05-06 LAB — PLATELET INHIBITION P2Y12: Platelet Function  P2Y12: 212 [PRU] (ref 194–418)

## 2015-05-06 LAB — LIPASE, BLOOD: LIPASE: 26 U/L (ref 22–51)

## 2015-05-06 LAB — TROPONIN I: Troponin I: <0.03 ng/mL (ref <0.031)

## 2015-05-06 MED ORDER — PANTOPRAZOLE SODIUM 40 MG PO TBEC: 40.0000 mg | DELAYED_RELEASE_TABLET | Freq: Every day | ORAL | Status: DC

## 2015-05-06 MED ORDER — BENAZEPRIL HCL 40 MG PO TABS
40.0000 mg | ORAL_TABLET | Freq: Every day | ORAL | Status: DC
  Filled 2015-05-06: qty 1

## 2015-05-06 MED ORDER — SODIUM CHLORIDE 0.9 % IV SOLN: 250.0000 mL | INTRAVENOUS | Status: DC | PRN

## 2015-05-06 MED ORDER — ATORVASTATIN CALCIUM 40 MG PO TABS: 40.0000 mg | ORAL_TABLET | Freq: Every day | ORAL | Status: DC

## 2015-05-06 MED ORDER — VITAMIN D 1000 UNITS PO TABS: 2000.0000 [IU] | ORAL_TABLET | Freq: Every day | ORAL | Status: DC

## 2015-05-06 MED ORDER — ASPIRIN 81 MG PO CHEW
81.0000 mg | CHEWABLE_TABLET | ORAL | Status: AC
  Administered 2015-05-07: 81 mg via ORAL
  Filled 2015-05-06: qty 1

## 2015-05-06 MED ORDER — ONDANSETRON HCL 4 MG/2ML IJ SOLN
4.0000 mg | Freq: Once | INTRAMUSCULAR | Status: AC
  Administered 2015-05-06: 4 mg via INTRAVENOUS
  Filled 2015-05-06: qty 2

## 2015-05-06 MED ORDER — SODIUM CHLORIDE 0.9 % WEIGHT BASED INFUSION
3.0000 mL/kg/h | INTRAVENOUS | Status: DC
  Administered 2015-05-07: 3 mL/kg/h via INTRAVENOUS

## 2015-05-06 MED ORDER — HEPARIN SODIUM (PORCINE) 5000 UNIT/ML IJ SOLN
5000.0000 [IU] | Freq: Three times a day (TID) | INTRAMUSCULAR | Status: DC
  Administered 2015-05-06 – 2015-05-07 (×2): 5000 [IU] via SUBCUTANEOUS
  Filled 2015-05-06 (×2): qty 1

## 2015-05-06 MED ORDER — ACETAMINOPHEN 325 MG PO TABS
650.0000 mg | ORAL_TABLET | ORAL | Status: DC | PRN
  Administered 2015-05-06: 650 mg via ORAL
  Filled 2015-05-06: qty 2

## 2015-05-06 MED ORDER — CLOPIDOGREL BISULFATE 75 MG PO TABS: 75.0000 mg | ORAL_TABLET | Freq: Every day | ORAL | Status: DC

## 2015-05-06 MED ORDER — ONDANSETRON HCL 4 MG/2ML IJ SOLN
4.0000 mg | Freq: Four times a day (QID) | INTRAMUSCULAR | Status: DC | PRN
  Administered 2015-05-07: 4 mg via INTRAVENOUS
  Filled 2015-05-06: qty 2

## 2015-05-06 MED ORDER — VITAMIN D 1000 UNITS PO TABS
1000.0000 [IU] | ORAL_TABLET | Freq: Every day | ORAL | Status: DC
  Administered 2015-05-06: 1000 [IU] via ORAL
  Filled 2015-05-06: qty 1

## 2015-05-06 MED ORDER — NITROGLYCERIN 0.4 MG SL SUBL: 0.4000 mg | SUBLINGUAL_TABLET | SUBLINGUAL | Status: DC | PRN

## 2015-05-06 MED ORDER — KETOROLAC TROMETHAMINE 0.5 % OP SOLN
1.0000 [drp] | Freq: Two times a day (BID) | OPHTHALMIC | Status: DC
  Filled 2015-05-06: qty 5
  Filled 2015-05-06: qty 3

## 2015-05-06 MED ORDER — HYDRALAZINE HCL 20 MG/ML IJ SOLN: 10.0000 mg | Freq: Four times a day (QID) | INTRAMUSCULAR | Status: DC | PRN

## 2015-05-06 MED ORDER — EZETIMIBE 10 MG PO TABS: 10.0000 mg | ORAL_TABLET | Freq: Every day | ORAL | Status: DC

## 2015-05-06 MED ORDER — ASPIRIN 81 MG PO CHEW
324.0000 mg | CHEWABLE_TABLET | Freq: Once | ORAL | Status: AC
  Administered 2015-05-06: 324 mg via ORAL
  Filled 2015-05-06: qty 4

## 2015-05-06 MED ORDER — PREDNISOLONE ACETATE 1 % OP SUSP
1.0000 [drp] | Freq: Two times a day (BID) | OPHTHALMIC | Status: DC
  Filled 2015-05-06: qty 5
  Filled 2015-05-06: qty 1

## 2015-05-06 MED ORDER — ASPIRIN EC 81 MG PO TBEC: 81.0000 mg | DELAYED_RELEASE_TABLET | Freq: Every day | ORAL | Status: DC

## 2015-05-06 MED ORDER — INSULIN ASPART 100 UNIT/ML ~~LOC~~ SOLN
0.0000 [IU] | Freq: Three times a day (TID) | SUBCUTANEOUS | Status: DC
  Administered 2015-05-06: 2 [IU] via SUBCUTANEOUS

## 2015-05-06 MED ORDER — SODIUM CHLORIDE 0.9 % WEIGHT BASED INFUSION: 1.0000 mL/kg/h | INTRAVENOUS | Status: DC

## 2015-05-06 MED ORDER — METOPROLOL SUCCINATE ER 100 MG PO TB24: 100.0000 mg | ORAL_TABLET | Freq: Every day | ORAL | Status: DC

## 2015-05-06 MED ORDER — SODIUM CHLORIDE 0.9 % IJ SOLN: 3.0000 mL | INTRAMUSCULAR | Status: DC | PRN

## 2015-05-06 MED ORDER — AMLODIPINE BESY-BENAZEPRIL HCL 10-40 MG PO CAPS: 1.0000 | ORAL_CAPSULE | Freq: Every day | ORAL | Status: DC

## 2015-05-06 MED ORDER — ISOSORBIDE MONONITRATE ER 30 MG PO TB24
30.0000 mg | ORAL_TABLET | Freq: Every day | ORAL | Status: DC
  Administered 2015-05-06: 30 mg via ORAL
  Filled 2015-05-06: qty 1

## 2015-05-06 MED ORDER — AMLODIPINE BESYLATE 10 MG PO TABS: 10.0000 mg | ORAL_TABLET | Freq: Every day | ORAL | Status: DC

## 2015-05-06 MED ORDER — SODIUM CHLORIDE 0.9 % IJ SOLN
3.0000 mL | Freq: Two times a day (BID) | INTRAMUSCULAR | Status: DC
  Administered 2015-05-06: 3 mL via INTRAVENOUS

## 2015-05-06 NOTE — ED Notes (Signed)
Report attempted charge nurse to assign pt and call back.

## 2015-05-06 NOTE — ED Provider Notes (Signed)
CSN: [id number]     Arrival date & time 05/06/15  1040 History   First MD Initiated Contact with Patient 05/06/15 1058     Chief Complaint  Patient presents with  . Chest Pain     (Consider location/radiation/quality/duration/timing/severity/associated sxs/prior Treatment) HPI  69 year old female presents for evaluation of a 45 minute episode of chest pain. She was driving to work when she developed a sudden chest pain that radiated to her back as well as left upper chest/arm heaviness. She felt a little lightheaded and a mild headache at the time. She felt nausea but did not vomit. Patient was having trouble catching her breath as well. Patient states this felt somewhat similar to when she had her heart attack and 2006 that required stent. She has similar episode to this a few months back and her cardiologist, Dr. Gwenlyn Found, ordered a stress test that was reportedly normal. Patient took one nitroglycerin with no relief and so she saw her nurse at work. She was hypertensive with systolic blood pressure of 180. Eventually her pain went away on its own. She now has mild lightheadedness and nausea. Of note patient takes lasix only when she notices leg swelling, not daily. Over past 3 days had had bilateral lower leg swelling but has not started lasix yet.  Past Medical History  Diagnosis Date  . Coronary artery disease     status post LAD stenting by Dr. [name] in 2006  . Type 2 diabetes mellitus   . Hypertension   . Hyperlipidemia   . Obstructive sleep apnea     on CPAP  . Hx of echocardiogram 05/2009    was excellent with normal LV function   . History of stress test 08/201    was normal  . Morbid obesity     contemplating bariatric surgery   Past Surgical History  Procedure Laterality Date  . Stents  2006    By Dr Janene Madeira revealing a patent stent, and otherwise no significant CAD and normal LV function.  . Cardiac catheterization     Family History  Problem Relation Age of Onset   . Heart attack Father   . Hypertension Maternal Grandmother   . Cancer - Lung Maternal Grandfather   . Heart attack Paternal Grandfather   . Heart attack Paternal Grandmother   . Dementia Mother    History  Substance Use Topics  . Smoking status: Never Smoker   . Smokeless tobacco: Not on file  . Alcohol Use: No   OB History    No data available     Review of Systems  Respiratory: Positive for shortness of breath.   Cardiovascular: Positive for chest pain and leg swelling.  Gastrointestinal: Positive for nausea. Negative for vomiting.  Neurological: Positive for light-headedness.      Allergies  Oxycodone-aspirin; Penicillins; Sulfa antibiotics; and Thiazide-type diuretics  Home Medications   Prior to Admission medications   Medication Sig Start Date End Date Taking? Authorizing Provider  alendronate (FOSAMAX) 70 MG tablet Take 70 mg by mouth every 7 (seven) days. 12/17/12   Historical Provider, MD  amLODipine-benazepril (LOTREL) 10-40 MG per capsule Take 1 capsule by mouth daily.  02/04/13   Historical Provider, MD  aspirin 81 MG tablet Take 81 mg by mouth daily.    Historical Provider, MD  cholecalciferol (VITAMIN D) 1000 UNITS tablet Take 1,000 Units by mouth. 2 tabs in the morning and 1 tab at night    Historical Provider, MD  clopidogrel (PLAVIX) 75 MG  tablet Take 75 mg by mouth daily. 01/27/13   Historical Provider, MD  furosemide (LASIX) 20 MG tablet Take 1 tablet (20 mg total) by mouth daily. 01/13/15   Isaiah Serge, NP  meloxicam (MOBIC) 15 MG tablet Take 15 mg by mouth daily. 03/16/14   Historical Provider, MD  metFORMIN (GLUCOPHAGE) 500 MG tablet Take 500 mg by mouth 2 (two) times daily. 03/18/13   Historical Provider, MD  metoprolol succinate (TOPROL-XL) 100 MG 24 hr tablet Take 1 tablet by mouth daily. 03/21/13   Historical Provider, MD  NITROSTAT 0.4 MG SL tablet Place 1 tablet under the tongue every 5 (five) minutes x 3 doses as needed. 12/29/13   Historical  Provider, MD  pantoprazole (PROTONIX) 40 MG tablet Take 40 mg by mouth daily. 03/18/13   Historical Provider, MD  potassium chloride (K-DUR) 10 MEQ tablet Take 1 tablet (10 mEq total) by mouth daily. 01/13/15   Isaiah Serge, NP  VYTORIN 10-40 MG per tablet Take 1 tablet by mouth daily. 02/04/13   Historical Provider, MD   BP 163/64 mmHg  Pulse 75  Temp(Src) 98.4 F (36.9 C) (Oral)  Resp 13  Ht 5\' 4"  (1.626 m)  Wt 300 lb (136.079 kg)  BMI 51.47 kg/m2  SpO2 98% Physical Exam  Constitutional: She is oriented to person, place, and time. She appears well-developed and well-nourished. No distress.  HENT:  Head: Normocephalic and atraumatic.  Right Ear: External ear normal.  Left Ear: External ear normal.  Nose: Nose normal.  Eyes: Right eye exhibits no discharge. Left eye exhibits no discharge.  Cardiovascular: Normal rate, regular rhythm and normal heart sounds.   Pulmonary/Chest: Effort normal and breath sounds normal.  Abdominal: Soft. She exhibits no distension. There is no tenderness.  Musculoskeletal: She exhibits edema (bilateral lower leg edema).  Neurological: She is alert and oriented to person, place, and time.  Skin: Skin is warm and dry. She is not diaphoretic.  Nursing note and vitals reviewed.   ED Course  Procedures (including critical care time) Labs Review Labs Reviewed  BASIC METABOLIC PANEL - Abnormal; Notable for the following:    Glucose, Bld 142 (*)    Creatinine, Ser 1.09 (*)    GFR calc non Af Amer 51 (*)    GFR calc Af Amer 59 (*)    All other components within normal limits  CBC  BRAIN NATRIURETIC PEPTIDE  I-STAT TROPOININ, ED    Imaging Review Dg Chest 2 View  05/06/2015   CLINICAL DATA:  Chest pain  EXAM: CHEST  2 VIEW  COMPARISON:  11/21/2004  FINDINGS: Cardiomediastinal silhouette is stable. No acute infiltrate or pleural effusion. No pulmonary edema. Bony thorax is unremarkable.  IMPRESSION: No active cardiopulmonary disease.   Electronically  Signed   By: Lahoma Crocker M.D.   On: 05/06/2015 11:35     EKG Interpretation   Date/Time:  Thursday May 06 2015 10:50:42 EDT Ventricular Rate:  83 PR Interval:  160 QRS Duration: 84 QT Interval:  366 QTC Calculation: 430 R Axis:   60 Text Interpretation:  Normal sinus rhythm Nonspecific T wave abnormality T  waves less prominent compared to 2006 Confirmed by Sovereign Ramiro  MD, [place]  ([id number]) on 05/06/2015 11:00:03 AM      MDM   Final diagnoses:  Other chest pain    Patient is pain-free at this time but has a concerning chest pain story. No evidence of MI but given her past history she will need admission and  likely heart catheterization to further characterize where this chest pain is coming from. Given she is pain-free have much lower suspicion for dissection or pulmonary embolism.    Sherwood Gambler, MD 05/06/15 [phone]

## 2015-05-06 NOTE — Telephone Encounter (Signed)
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 Please advise  Thanks

## 2015-05-06 NOTE — ED Notes (Signed)
Cardiology PA at bedside. 

## 2015-05-06 NOTE — H&P (Signed)
History and Physical  Patient ID: Deborah Barber MRN: [id number], DOB: 01-Aug-1946 Date of Encounter: 05/06/2015, 1:23 PM Primary Physician: Merian Capron, MD Primary Cardiologist: Dr. Gwenlyn Found  Chief Complaint: chest pain Reason for Admission: chest pain  HPI: [name] is a 69 y/o female with history of CAD (s/p prox LAD stenting in 2006), essential HTN, HLD, OSA on CPAP, NIDDM, morbid obesity who presented to [facility] with chest pain. Last cath was said to be in 2007 revealing widely patent stent and otherwise no significant CAD with normal LV function. At last OV in 01/2015 she had reported two episodes of severe left chest pain lasting about 45 minutes, resolving spontaneously. Lexiscan nuclear stress test 01/2015 showed normal perfusion, no ischemia, EF 72%. Her blood pressure was elevated and HCTZ was initially added but [facility] had on file that she was allergic to this, so it was changed to Lasix daily. However, the patient has only been taking this PRN ankle swelling. She has severe knee problems but they will not operate on her until she undergoes bariatric surgery. She is currently enrolled in a program to proceed forward, but she has a list of things she wants to accomplish before then - getting eye surgery (done), getting routine colonoscopy (upcoming this summer), and getting a skin cancer taken off her back (coming up next week). She feels aside from her knee issues though she has not had any cardiac symptoms until today. Her mobility is extremely limited due to her chronic knee issues.  She was driving to work in her usual state of health this morning when suddenly she developed a sensation of chest pain under her left breast radiating to her back, associated with chest pressure/heaviness, dyspnea, nausea, head pressure, and jaw pain. This was a little bit different than the pain she had experienced in April 2016. It was similar to her prior anginal pain in 2006 in that she had had chest  pain to her back and really significant jaw discomfort at that time as well. She took SL NTG x1 without significant relief. She went to see her company nurse who told her BP was 180/60 then recheck 184/90, HR on the higher side of normal, pulse ox 94-99%. They advised she go to to ER and our office reinforced that advice. The patient's pain gradually subsided upon arrival without further intervention - total duration 1.5 hours. She did have residual nausea and received Zofran with relief. She did not report anything in particular making it worse or better. She currently feels fine except "tired, like I've been through something." Labs notable for normal BNP, negative troponin, normal CBC, Cr 1.09 (prev 1.18). CXR without active CP disease. BPs 150s-160s/50s-60s in the ER. She does not follow it at home but reports in the doctor it usually runs 765Y systolic. She reports intermittent ankle edema, not new. Denies orthopnea, PND, syncope, palpitations, vomiting or bleeding.   Past Medical History  Diagnosis Date  . Coronary artery disease     a. s/p LAD stenting by Dr. [name] in 2006.  . Type 2 diabetes mellitus   . Hypertension   . Hyperlipidemia   . Obstructive sleep apnea     on CPAP  . Hx of echocardiogram 05/2009    was excellent with normal LV function   . History of stress test 08/201    was normal  . Morbid obesity     contemplating bariatric surgery  . Skin cancer     a. Biopsy in  2016 pending removal.     Most Recent Cardiac Studies: 2D echo 2010: technically difficult. LVEF normal. Normal LV thickness. Mild TR, Mild MR. Nuc/cath as above.    Surgical History:  Past Surgical History  Procedure Laterality Date  . Coronary angioplasty  2006     Home Meds: Prior to Admission medications   Medication Sig Start Date End Date Taking? Authorizing Provider  amLODipine-benazepril (LOTREL) 10-40 MG per capsule Take 1 capsule by mouth daily.  02/04/13  Yes Historical Provider, MD    aspirin 81 MG tablet Take 81 mg by mouth daily.   Yes Historical Provider, MD  cholecalciferol (VITAMIN D) 1000 UNITS tablet Take 1,000-2,000 Units by mouth 2 (two) times daily. 2 tabs in the morning and 1 tab at night   Yes Historical Provider, MD  clopidogrel (PLAVIX) 75 MG tablet Take 75 mg by mouth daily. 01/27/13  Yes Historical Provider, MD  furosemide (LASIX) 20 MG tablet Take 1 tablet (20 mg total) by mouth daily. Patient taking differently: Take 20 mg by mouth daily as needed for fluid.  01/13/15  Yes Isaiah Serge, NP  meloxicam (MOBIC) 15 MG tablet Take 15 mg by mouth daily. 03/16/14  Yes Historical Provider, MD  metFORMIN (GLUCOPHAGE) 500 MG tablet Take 500 mg by mouth 2 (two) times daily. 03/18/13  Yes Historical Provider, MD  metoprolol succinate (TOPROL-XL) 100 MG 24 hr tablet Take 1 tablet by mouth daily. 03/21/13  Yes Historical Provider, MD  NITROSTAT 0.4 MG SL tablet Place 1 tablet under the tongue every 5 (five) minutes x 3 doses as needed. 12/29/13  Yes Historical Provider, MD  pantoprazole (PROTONIX) 40 MG tablet Take 40 mg by mouth daily. 03/18/13  Yes Historical Provider, MD  potassium chloride (K-DUR) 10 MEQ tablet Take 1 tablet (10 mEq total) by mouth daily. Patient taking differently: Take 10 mEq by mouth daily as needed (fluid).  01/13/15  Yes Isaiah Serge, NP  VYTORIN 10-40 MG per tablet Take 1 tablet by mouth daily. 02/04/13  Yes Historical Provider, MD    Allergies:  Allergies  Allergen Reactions  . Oxycodone-Aspirin Nausea Only and Swelling    Other reaction(s): Vertigo  . Penicillins     Pt doesn't remember reaction  . Sulfa Antibiotics     Swelling, skin turned red  . Thiazide-Type Diuretics     Patient does not recall allergy to this. [facility] listed on file.    History   Social History  . Marital Status: Divorced    Spouse Name: N/A  . Number of Children: N/A  . Years of Education: N/A   Occupational History  . Not on file.   Social History  Main Topics  . Smoking status: Never Smoker   . Smokeless tobacco: Not on file  . Alcohol Use: No  . Drug Use: No  . Sexual Activity: Not on file   Other Topics Concern  . Not on file   Social History Narrative     Family History  Problem Relation Age of Onset  . Heart attack Father     MI age 60 and multiple MIs thereafter  . Hypertension Maternal Grandmother   . Cancer - Lung Maternal Grandfather   . Heart attack Paternal Grandfather   . Heart attack Paternal Grandmother   . Dementia Mother     Review of Systems: All other systems reviewed and are otherwise negative except as noted above.  Labs:   Lab Results  Component Value Date  WBC 9.5 05/06/2015   HGB 12.6 05/06/2015   HCT 39.4 05/06/2015   MCV 82.4 05/06/2015   PLT 255 05/06/2015    Recent Labs Lab 05/06/15 1114  NA 139  K 3.9  CL 106  CO2 25  BUN 14  CREATININE 1.09*  CALCIUM 9.6  GLUCOSE 142*   Troponin negx1. BNP 63.  Radiology/Studies:  Dg Chest 2 View  05/06/2015   CLINICAL DATA:  Chest pain  EXAM: CHEST  2 VIEW  COMPARISON:  11/21/2004  FINDINGS: Cardiomediastinal silhouette is stable. No acute infiltrate or pleural effusion. No pulmonary edema. Bony thorax is unremarkable.  IMPRESSION: No active cardiopulmonary disease.   Electronically Signed   By: Lahoma Crocker M.D.   On: 05/06/2015 11:35   Wt Readings from Last 3 Encounters:  05/06/15 300 lb (136.079 kg)  01/21/15 298 lb (135.172 kg)  01/13/15 298 lb 9.6 oz (135.444 kg)   EKG: NSR 83bpm, nonspecific T wave changes but nothing acute and no change from prior  Physical Exam: Blood pressure 150/55, pulse 83, temperature 98.4 F (36.9 C), temperature source Oral, resp. rate 21, height 5\' 4"  (1.626 m), weight 300 lb (136.079 kg), SpO2 97 %. General: Well developed obese WF in no acute distress. Head: Normocephalic, atraumatic, sclera non-icteric, no xanthomas, nares are without discharge.  Neck: Negative for carotid bruits. JVD not  elevated. Lungs: Clear bilaterally to auscultation without wheezes, rales, or rhonchi. Breathing is unlabored. Heart: RRR with S1 S2. No murmurs, rubs, or gallops appreciated. Abdomen: Soft, non-tender, non-distended with normoactive bowel sounds. No hepatomegaly. No rebound/guarding. No obvious abdominal masses. Msk:  Strength and tone appear normal for age. Extremities: No clubbing or cyanosis. No edema.  Distal pedal pulses are 2+ and equal bilaterally. Neuro: Alert and oriented X 3. No focal deficit. No facial asymmetry. Moves all extremities spontaneously. Psych:  Responds to questions appropriately with a normal affect.    ASSESSMENT AND PLAN:   1. Chest pain, somewhat suspicious for unstable angina - although there is no objective evidence of ischemia thus far, EKG was obtained after pain had resolved and she has only had one troponin so far. Symptoms reminded her of prior angina in 2006 and were different than the ones that prompted stress testing in 01/2015. Noninvasive testing is less sensitive given her morbid obesity. Recommend definitive evaluation with cardiac cath in AM. Risks and benefits of cardiac catheterization have been discussed with the patient. These include bleeding, infection, kidney damage, stroke, heart attack, death.  The patient understands these risks and is willing to proceed. Will admit and cycle troponins. Check P2Y12. Add heparin if enzymes + or if she has recurrent pain. Check LFTs and lipase as well.  2. CAD s/p LAD stent in 2006 - continue aspirin, Plavix, BB, and statin (see below re: statin).  3. HTN, may be poorly controlled - continue home regimen and follow. Will add Imdur 30mg  daily.   3. Morbid obesity Body mass index is 51.47 kg/(m^2). with OSA, compliant with CPAP - going through workup for batriatric surgery as above.  4. Diabetes mellitus - hold Metformin. Add SSI. Check A1C.   5. Hyperlipidemia - check lipids in AM. Note max dose of simvastatin  in patients taking amlodipine is 20mg  daily. She is on simvastatin 40/ezetimibe 10mg  daily. Will sub out the simvastatin for atorvastatin for now.    SignedMelina Copa PA-C 05/06/2015, 1:23 PM Pager: [phone]  Personally seen and examined. Agree with above. Recurrent chest pain with prior LAD  stent in 2006 despite normal troponin, ecg, NUC stress test (decreased sensitivity given body habitus). To get a definitive answer, will need left heart catheterization. Plan for further observation tonight and cath tomorrow. Radial approach preferable given obesity. She is amenable to plan. She has several upcoming procedures and would like to be sure that coronary disease has not progressed.   Candee Furbish, MD

## 2015-05-06 NOTE — ED Notes (Signed)
Ordered patient diet Tray

## 2015-05-06 NOTE — Telephone Encounter (Signed)
Spoke with patient who is at work and is experiencing chest pain and heaviness, left arm pain, pain radiating to her back, jaw pain, nausea, funny feeling in head. She states she took NTGx1 and then went to see her company nurse. She sates her BP was 180/60 and a recheck was 184/90 and her HR was 100-108bpm, RR 20-24, oxygen 94-99% on room air. Company nurse advised patient to go to ED for eval but to call doctor's office first. Patient describes her symptoms as like when she had a heart attack in the past. Advised patient to go to [facility] ED for eval. She will go by private vehicle. She will call her daughter, who is in [place], to come get her. She states she works [place] [employer], not far from [facility] ED, but will likely wait on transportation from her daughter. Will call  to notify nursing staff.

## 2015-05-06 NOTE — ED Notes (Signed)
PT states L sided arm pressure that radiates to L jaw and feeling "wooly headed" while she was driving to work this am. Symptoms were accompanied by sob and nausea. Pt did take 1 nitro with no initial relief, but states only mild pressure right now.

## 2015-05-06 NOTE — ED Notes (Signed)
MD at bedside. [place] 

## 2015-05-07 ENCOUNTER — Encounter (HOSPITAL_COMMUNITY)
Admission: EM | Disposition: A | Discharge status: Home / Self Care | Payer: Self-pay | Source: Home / Self Care | Attending: Emergency Medicine

## 2015-05-07 ENCOUNTER — Encounter (HOSPITAL_COMMUNITY): Payer: Self-pay | Admitting: Interventional Cardiology

## 2015-05-07 DIAGNOSIS — G4733 Obstructive sleep apnea (adult) (pediatric): Secondary | ICD-10-CM | POA: Diagnosis not present

## 2015-05-07 DIAGNOSIS — I2510 Atherosclerotic heart disease of native coronary artery without angina pectoris: Secondary | ICD-10-CM | POA: Diagnosis not present

## 2015-05-07 DIAGNOSIS — I10 Essential (primary) hypertension: Secondary | ICD-10-CM | POA: Diagnosis not present

## 2015-05-07 DIAGNOSIS — E785 Hyperlipidemia, unspecified: Secondary | ICD-10-CM | POA: Diagnosis not present

## 2015-05-07 HISTORY — PX: CARDIAC CATHETERIZATION: SHX172

## 2015-05-07 LAB — BASIC METABOLIC PANEL
Anion gap: 9 (ref 5–15)
BUN: 12 mg/dL (ref 6–20)
CALCIUM: 9.3 mg/dL (ref 8.9–10.3)
CO2: 25 mmol/L (ref 22–32)
Chloride: 104 mmol/L (ref 101–111)
Creatinine, Ser: 1.2 mg/dL — ABNORMAL HIGH (ref 0.44–1.00)
GFR calc Af Amer: 53 mL/min — ABNORMAL LOW (ref >60)
GFR calc non Af Amer: 45 mL/min — ABNORMAL LOW (ref >60)
Glucose, Bld: 144 mg/dL — ABNORMAL HIGH (ref 65–99)
Potassium: 4.3 mmol/L (ref 3.5–5.1)
SODIUM: 138 mmol/L (ref 135–145)

## 2015-05-07 LAB — CBC
HCT: 36.1 % (ref 36.0–46.0)
HEMOGLOBIN: 11.5 g/dL — AB (ref 12.0–15.0)
MCH: 26 pg (ref 26.0–34.0)
MCHC: 31.9 g/dL (ref 30.0–36.0)
MCV: 81.5 fL (ref 78.0–100.0)
Platelets: 243 10*3/uL (ref 150–400)
RBC: 4.43 MIL/uL (ref 3.87–5.11)
RDW: 15 % (ref 11.5–15.5)
WBC: 8.4 10*3/uL (ref 4.0–10.5)

## 2015-05-07 LAB — LIPID PANEL
CHOLESTEROL: 130 mg/dL (ref 0–200)
HDL: 45 mg/dL (ref >40)
LDL CALC: 47 mg/dL (ref 0–99)
Total CHOL/HDL Ratio: 2.9 RATIO
Triglycerides: 188 mg/dL — ABNORMAL HIGH (ref <150)
VLDL: 38 mg/dL (ref 0–40)

## 2015-05-07 LAB — HEMOGLOBIN A1C
Hgb A1c MFr Bld: 7.2 % — ABNORMAL HIGH (ref 4.8–5.6)
Mean Plasma Glucose: 160 mg/dL

## 2015-05-07 LAB — GLUCOSE, CAPILLARY
GLUCOSE-CAPILLARY: 126 mg/dL — AB (ref 65–99)
Glucose-Capillary: 120 mg/dL — ABNORMAL HIGH (ref 65–99)

## 2015-05-07 LAB — TROPONIN I: Troponin I: <0.03 ng/mL (ref <0.031)

## 2015-05-07 SURGERY — LEFT HEART CATH AND CORONARY ANGIOGRAPHY

## 2015-05-07 MED ORDER — EZETIMIBE 10 MG PO TABS: 10.0000 mg | ORAL_TABLET | Freq: Every day | ORAL | Status: DC

## 2015-05-07 MED ORDER — LIDOCAINE HCL (PF) 1 % IJ SOLN
INTRAMUSCULAR | Status: DC | PRN
  Administered 2015-05-07: 10:00:00

## 2015-05-07 MED ORDER — IOHEXOL 350 MG/ML SOLN
INTRAVENOUS | Status: DC | PRN
  Administered 2015-05-07: 70 mL via INTRACARDIAC

## 2015-05-07 MED ORDER — ONDANSETRON HCL 4 MG/2ML IJ SOLN: 4.0000 mg | Freq: Four times a day (QID) | INTRAMUSCULAR | Status: DC | PRN

## 2015-05-07 MED ORDER — HEPARIN SODIUM (PORCINE) 1000 UNIT/ML IJ SOLN
INTRAMUSCULAR | Status: AC
  Filled 2015-05-07: qty 1

## 2015-05-07 MED ORDER — LIDOCAINE HCL (PF) 1 % IJ SOLN
INTRAMUSCULAR | Status: AC
  Filled 2015-05-07: qty 30

## 2015-05-07 MED ORDER — SODIUM CHLORIDE 0.9 % WEIGHT BASED INFUSION: 3.0000 mL/kg/h | INTRAVENOUS | Status: AC

## 2015-05-07 MED ORDER — FENTANYL CITRATE (PF) 100 MCG/2ML IJ SOLN
INTRAMUSCULAR | Status: DC | PRN
  Administered 2015-05-07: 50 ug via INTRAVENOUS

## 2015-05-07 MED ORDER — SODIUM CHLORIDE 0.9 % IJ SOLN
INTRAMUSCULAR | Status: DC | PRN
  Administered 2015-05-07: 10:00:00 via INTRA_ARTERIAL

## 2015-05-07 MED ORDER — MIDAZOLAM HCL 2 MG/2ML IJ SOLN
INTRAMUSCULAR | Status: DC | PRN
  Administered 2015-05-07 (×2): 1 mg via INTRAVENOUS

## 2015-05-07 MED ORDER — HYDROMORPHONE HCL 1 MG/ML IJ SOLN
0.5000 mg | Freq: Once | INTRAMUSCULAR | Status: AC
  Administered 2015-05-07: 0.5 mg via INTRAVENOUS
  Filled 2015-05-07: qty 1

## 2015-05-07 MED ORDER — FENTANYL CITRATE (PF) 100 MCG/2ML IJ SOLN
INTRAMUSCULAR | Status: AC
  Filled 2015-05-07: qty 4

## 2015-05-07 MED ORDER — HEPARIN SODIUM (PORCINE) 1000 UNIT/ML IJ SOLN
INTRAMUSCULAR | Status: DC | PRN
  Administered 2015-05-07: 5000 [IU] via INTRAVENOUS

## 2015-05-07 MED ORDER — VERAPAMIL HCL 2.5 MG/ML IV SOLN
INTRAVENOUS | Status: AC
  Filled 2015-05-07: qty 2

## 2015-05-07 MED ORDER — ACETAMINOPHEN 325 MG PO TABS: 650.0000 mg | ORAL_TABLET | ORAL | Status: DC | PRN

## 2015-05-07 MED ORDER — ISOSORBIDE MONONITRATE ER 30 MG PO TB24: 30.0000 mg | ORAL_TABLET | Freq: Every day | ORAL | Status: DC

## 2015-05-07 MED ORDER — ATORVASTATIN CALCIUM 40 MG PO TABS: 40.0000 mg | ORAL_TABLET | Freq: Every day | ORAL | Status: DC

## 2015-05-07 MED ORDER — MIDAZOLAM HCL 2 MG/2ML IJ SOLN
INTRAMUSCULAR | Status: AC
  Filled 2015-05-07: qty 4

## 2015-05-07 MED ORDER — SODIUM CHLORIDE 0.9 % IV SOLN: 250.0000 mL | INTRAVENOUS | Status: DC | PRN

## 2015-05-07 MED ORDER — HEPARIN (PORCINE) IN NACL 2-0.9 UNIT/ML-% IJ SOLN
INTRAMUSCULAR | Status: AC
  Filled 2015-05-07: qty 1500

## 2015-05-07 MED ORDER — NITROGLYCERIN 1 MG/10 ML FOR IR/CATH LAB
INTRA_ARTERIAL | Status: AC
  Filled 2015-05-07: qty 10

## 2015-05-07 MED ORDER — SODIUM CHLORIDE 0.9 % IJ SOLN: 3.0000 mL | Freq: Two times a day (BID) | INTRAMUSCULAR | Status: DC

## 2015-05-07 MED ORDER — SODIUM CHLORIDE 0.9 % IJ SOLN: 3.0000 mL | INTRAMUSCULAR | Status: DC | PRN

## 2015-05-07 SURGICAL SUPPLY — 10 items
CATH INFINITI 5 FR JL3.5 (CATHETERS) ×3 IMPLANT
CATH INFINITI JR4 5F (CATHETERS) ×3 IMPLANT
DEVICE RAD COMP TR BAND LRG (VASCULAR PRODUCTS) ×3 IMPLANT
GLIDESHEATH SLEND A-KIT 6F 22G (SHEATH) ×3 IMPLANT
KIT HEART LEFT (KITS) ×3 IMPLANT
PACK CARDIAC CATHETERIZATION (CUSTOM PROCEDURE TRAY) ×3 IMPLANT
TRANSDUCER W/STOPCOCK (MISCELLANEOUS) ×3 IMPLANT
TUBING CIL FLEX 10 FLL-RA (TUBING) ×3 IMPLANT
WIRE HI TORQ VERSACORE-J 145CM (WIRE) ×3 IMPLANT
WIRE SAFE-T 1.5MM-J .035X260CM (WIRE) ×3 IMPLANT

## 2015-05-07 NOTE — Progress Notes (Signed)
1022 Received order for post PCI. Pt did not have PCI. Please re order for Phase 1 consult if needed for risk factor modification. Graylon Good RN BSN 05/07/2015 10:23 AM

## 2015-05-07 NOTE — Progress Notes (Signed)
Patient Name: Deborah Barber Date of Encounter: 05/07/2015   SUBJECTIVE  Just came from cath. Denies chest pain.   CURRENT MEDS . amLODipine  10 mg Oral Daily   And  . benazepril  40 mg Oral Daily  . aspirin EC  81 mg Oral Daily  . atorvastatin  40 mg Oral q1800  . cholecalciferol  1,000 Units Oral QHS  . cholecalciferol  2,000 Units Oral Daily  . clopidogrel  75 mg Oral Daily  . ezetimibe  10 mg Oral Daily  . insulin aspart  0-9 Units Subcutaneous TID WC  . isosorbide mononitrate  30 mg Oral Daily  . ketorolac  1 drop Right Eye BID  . metoprolol succinate  100 mg Oral Daily  . pantoprazole  40 mg Oral Daily  . prednisoLONE acetate  1 drop Right Eye BID  . sodium chloride  3 mL Intravenous Q12H    OBJECTIVE  Filed Vitals:   05/07/15 0942 05/07/15 0947 05/07/15 0952 05/07/15 1029  BP: 143/77 142/77 149/79 152/67  Pulse: 72 76 70 67  Temp:      TempSrc:      Resp: 11 15 13 16   Height:      Weight:      SpO2: 97% 93% 94% 92%    Intake/Output Summary (Last 24 hours) at 05/07/15 1102 Last data filed at 05/07/15 0449  Gross per 24 hour  Intake    480 ml  Output   1350 ml  Net   -870 ml   Filed Weights   05/06/15 1046 05/06/15 1646 05/07/15 0610  Weight: 300 lb (136.079 kg) 229 lb 4.8 oz (104.01 kg) 297 lb 11.2 oz (135.036 kg)    PHYSICAL EXAM  General: Pleasant, NAD. Neuro: Alert and oriented X 3. Moves all extremities spontaneously. Psych: Normal affect. HEENT:  Normal  Neck: Supple without bruits or JVD. Lungs:  Resp regular and unlabored, CTA. Heart: RRR no s3, s4, or murmurs. Abdomen: Soft, non-tender, non-distended, BS + x 4.  Extremities: No clubbing, cyanosis or edema. DP/PT/Radials 2+ and equal bilaterally. Right radial without hematoma.   Accessory Clinical Findings  CBC  Recent Labs  05/06/15 1840 05/07/15 0436  WBC 9.1 8.4  HGB 12.2 11.5*  HCT 38.6 36.1  MCV 81.8 81.5  PLT 276 161   Basic Metabolic Panel  Recent Labs   05/06/15 1114 05/06/15 1840 05/07/15 0436  NA 139  --  138  K 3.9  --  4.3  CL 106  --  104  CO2 25  --  25  GLUCOSE 142*  --  144*  BUN 14  --  12  CREATININE 1.09* 1.15* 1.20*  CALCIUM 9.6  --  9.3   Liver Function Tests  Recent Labs  05/06/15 1840  AST 21  ALT 16  ALKPHOS 65  BILITOT 0.6  PROT 6.4*  ALBUMIN 3.5    Recent Labs  05/06/15 1840  LIPASE 26   Cardiac Enzymes  Recent Labs  05/06/15 1840 05/06/15 2230 05/07/15 0436  TROPONINI <0.03 <0.03 <0.03   Hemoglobin A1C  Recent Labs  05/06/15 1840  HGBA1C 7.2*   Fasting Lipid Panel  Recent Labs  05/07/15 0436  CHOL 130  HDL 45  LDLCALC 47  TRIG 188*  CHOLHDL 2.9   Thyroid Function Tests  Recent Labs  05/06/15 1840  TSH 0.668    TELE  NSR  Radiology/Studies  Dg Chest 2 View  05/06/2015   CLINICAL DATA:  Chest pain  EXAM: CHEST  2 VIEW  COMPARISON:  11/21/2004  FINDINGS: Cardiomediastinal silhouette is stable. No acute infiltrate or pleural effusion. No pulmonary edema. Bony thorax is unremarkable.  IMPRESSION: No active cardiopulmonary disease.   Electronically Signed   By: Lahoma Crocker M.D.   On: 05/06/2015 11:35   Cath 05/07/15 Conclusion    1. Prox to mid LAD lesion, 15% stenosed. The lesion was previously treated with a stent (unknown type) . 2. Ost 1st Diag lesion, 75% stenosed, and jailed by the LAD stent. 3. Mid LAD to Dist LAD lesion, 50% stenosed. 4. Normal left ventricular systolic function, EF 82-70%, with elevated end-diastolic pressure greater than 20 mmHg. These findings are compatible with diastolic heart failure.   Widely patent coronary arteries with diffuse disease in the mid to distal LAD as outlined above. The first diagonal is jailed by the previously placed LAD stent and has an ostial 70-75% stenosis unchanged from prior angiograms. The LAD stent is without evidence of significant obstruction/restenosis.  Widely patent right and circumflex coronary  arteries  Normal left ventricular systolic function with EF of 78% and diastolic dysfunction with end-diastolic pressure greater than 20 mmHg.   RECOMMENDATIONS:   Continued risk factor modification     Coronary Findings    Dominance: Right   Left Anterior Descending   . Ost LAD to Prox LAD lesion, 15% stenosed. The lesion is type non-C . The lesion was previously treated with a stent (unknown type) .   [name] Mid LAD to Dist LAD lesion, 50% stenosed. The lesion is type C tubular .   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 Ost 1st Diag lesion, 75% stenosed. The lesion is type C .     Ramus Intermedius  The vessel is small .     Left Circumflex   . First Obtuse Marginal Branch   The vessel is small in size.   . Third Obtuse Marginal Branch   The vessel is small in size.         ASSESSMENT AND PLAN  1. Chest pain, somewhat suspicious for unstable angina -  - cath showed patent coronary arteries with diffuse disease in the mid to distal ALD - Continue current regimen - added imdur 30mg .   2. CAD s/p LAD stent in 2006 - as above noted in cath   3. HTN, may be poorly controlled - continue home regimen and follow. Will add Imdur 30mg  daily.   3. Morbid obesity Body mass index is 51.47 kg/(m^2). with OSA, compliant with CPAP - going through workup for batriatric surgery as above.  4. Diabetes mellitus - held Metformin. Check A1C 7.2 - will advice her to f/u with PCP. Restart Metformin Sunday.   5. Hyperlipidemia -  05/07/2015: Cholesterol 130; HDL 45; LDL Cholesterol 47; Triglycerides 188*; VLDL 38  - resume home medications.   Signed, Leanor Kail PA-C Pager [phone]  Personally seen and examined. Agree with above. Reassuring cath.  Candee Furbish, MD

## 2015-05-07 NOTE — Progress Notes (Signed)
Pt places self on cpap for the night.

## 2015-05-07 NOTE — Discharge Summary (Signed)
Discharge Summary   Patient ID: Deborah Barber,  MRN: [id number], DOB/AGE: 1946-03-16 69 y.o.  Admit date: 05/06/2015 Discharge date: 05/07/2015  Primary Care Provider: Merian Capron Primary Cardiologist: Dr. Gwenlyn Found  Discharge Diagnoses Active Problems:   Coronary artery disease   Essential hypertension   Hyperlipidemia   Type 2 diabetes mellitus   Obstructive sleep apnea   Chest pain   Morbid obesity   Unstable angina   Allergies Allergies  Allergen Reactions  . Oxycodone-Aspirin Nausea Only and Swelling    Other reaction(s): Vertigo  . Penicillins     Pt doesn't remember reaction  . Sulfa Antibiotics     Swelling, skin turned red  . Thiazide-Type Diuretics     Patient does not recall allergy to this. [facility].    Procedures  Cath 05/07/15 Conclusion    1. Prox to mid LAD lesion, 15% stenosed. The lesion was previously treated with a stent (unknown type) . 2. Ost 1st Diag lesion, 75% stenosed, and jailed by the LAD stent. 3. Mid LAD to Dist LAD lesion, 50% stenosed. 4. Normal left ventricular systolic function, EF 36-64%, with elevated end-diastolic pressure greater than 20 mmHg. These findings are compatible with diastolic heart failure.   Widely patent coronary arteries with diffuse disease in the mid to distal LAD as outlined above. The first diagonal is jailed by the previously placed LAD stent and has an ostial 70-75% stenosis unchanged from prior angiograms. The LAD stent is without evidence of significant obstruction/restenosis.  Widely patent right and circumflex coronary arteries  Normal left ventricular systolic function with EF of 40% and diastolic dysfunction with end-diastolic pressure greater than 20 mmHg.   RECOMMENDATIONS:   Continued risk factor modification     Coronary Findings    Dominance: Right   Left Anterior Descending   . Ost LAD to Prox LAD lesion, 15% stenosed. The lesion is type non-C . The  lesion was previously treated with a stent (unknown type) .   [name] Mid LAD to Dist LAD lesion, 50% stenosed. The lesion is type C tubular .   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 Ost 1st Diag lesion, 75% stenosed. The lesion is type C .     Ramus Intermedius  The vessel is small .     Left Circumflex   . First Obtuse Marginal Branch   The vessel is small in size.   . Third Obtuse Marginal Branch   The vessel is small in size.            History of Present Illness  Deborah Barber is a 69 y/o female with history of CAD (s/p prox LAD stenting in 2006), essential HTN, HLD, OSA on CPAP, NIDDM, morbid obesity who presented 05/06/15 to [facility] with chest pain. Last cath was said to be in 2007 revealing widely patent stent and otherwise no significant CAD with normal LV function. At last OV in 01/2015 she had reported two episodes of severe left chest pain lasting about 45 minutes, resolving spontaneously. Lexiscan nuclear stress test 01/2015 showed normal perfusion, no ischemia, EF 72%. Her blood pressure was elevated and HCTZ was initially added but [facility] had on file that she was allergic to this, so it was changed to Lasix daily. However, the patient has only been taking this PRN ankle swelling. She has severe knee problems but they will not operate on her until she undergoes bariatric surgery. She is currently enrolled in a program to  proceed forward, but she has a list of things she wants to accomplish before then - getting eye surgery (done), getting routine colonoscopy (upcoming this summer), and getting a skin cancer taken off her back (coming up next week). She feels aside from her knee issues though she has not had any cardiac symptoms until today. Her mobility is extremely limited due to her chronic knee issues.  She suddenly developed a sensation of chest pain under her left breast radiating to her back, associated with chest pressure/heaviness, dyspnea, nausea, head  pressure, and jaw pain while driving to work. This was a little bit different than the pain she had experienced in April 2016. It was similar to her prior anginal pain in 2006 in that she had had chest pain to her back and really significant jaw discomfort at that time as well. She took SL NTG x1 without significant relief. She went to see her company nurse who told her BP was 180/60 then recheck 184/90, HR on the higher side of normal, pulse ox 94-99%. They advised she go to to ER and our office reinforced that advice. The patient's pain gradually subsided upon arrival without further intervention - total duration 1.5 hours. She did have residual nausea and received Zofran with relief in ED. Labs notable for normal BNP, negative troponin, normal CBC, Cr 1.09 (prev 1.18). CXR without active CP disease. BPs 150s-160s/50s-60s in the ER. She does not follow it at home but reports in the doctor it usually runs 588F systolic.  Denies orthopnea, PND, syncope, palpitations, vomiting or bleeding. EKG without acute abnormality.    Hospital Course  She was admitted for suspicious unstable angina. Cath was recommended for definite evaluation given her morbid obesity. Imdur was added for BP and chest pain. She is on simvastatin 40/ezetimibe 10mg  daily at home. She was also on amlodipine. Thus simvastatin was substituted for atorvastatin. Cath showed patent coronary arteries with diffuse disease in the mid to distal LAD. Plan for medical management.  No further chest pain. She was advice to f/u with PCP for DM. HgbA1c 7.2, her BS was high on SSI. Restart Metformin 48hours after cath. 05/07/2015: Cholesterol 130; HDL 45; LDL Cholesterol 47; Triglycerides 188*; VLDL 38.  She is compliant with CPAP and going through bariatric workup.  She was discharge on Imdur 30mg , titrate further as outpatient. Her home simvastatin discontinued and placed on lipitor 40mg . Continue Zetia 10mg , amlodipine/benazepril 10/40mg , asa 81mg , plavix  75mg  and Toprol xl 100mg .   Discharge Vitals Blood pressure 152/67, pulse 67, temperature 97.9 F (36.6 C), temperature source Oral, resp. rate 16, height 5\' 4"  (1.626 m), weight 297 lb 11.2 oz (135.036 kg), SpO2 92 %.  Filed Weights   05/06/15 1046 05/06/15 1646 05/07/15 0610  Weight: 300 lb (136.079 kg) 229 lb 4.8 oz (104.01 kg) 297 lb 11.2 oz (135.036 kg)    Labs  CBC  Recent Labs  05/06/15 1840 05/07/15 0436  WBC 9.1 8.4  HGB 12.2 11.5*  HCT 38.6 36.1  MCV 81.8 81.5  PLT 276 027   Basic Metabolic Panel  Recent Labs  05/06/15 1114 05/06/15 1840 05/07/15 0436  NA 139  --  138  K 3.9  --  4.3  CL 106  --  104  CO2 25  --  25  GLUCOSE 142*  --  144*  BUN 14  --  12  CREATININE 1.09* 1.15* 1.20*  CALCIUM 9.6  --  9.3   Liver Function Tests  Recent Labs  05/06/15 1840  AST 21  ALT 16  ALKPHOS 65  BILITOT 0.6  PROT 6.4*  ALBUMIN 3.5    Recent Labs  05/06/15 1840  LIPASE 26   Cardiac Enzymes  Recent Labs  05/06/15 1840 05/06/15 2230 05/07/15 0436  TROPONINI <0.03 <0.03 <0.03   BNP Invalid input(s): POCBNP D-Dimer No results for input(s): DDIMER in the last 72 hours. Hemoglobin A1C  Recent Labs  05/06/15 1840  HGBA1C 7.2*   Fasting Lipid Panel  Recent Labs  05/07/15 0436  CHOL 130  HDL 45  LDLCALC 47  TRIG 188*  CHOLHDL 2.9   Thyroid Function Tests  Recent Labs  05/06/15 1840  TSH 0.668    Disposition  Pt is being discharged home today in good condition.  Follow-up Plans & Appointments  Follow-up Information    Follow up with Quay Burow, MD On 06/09/2015.   Specialties:  Cardiology, Radiology   Why:  @10 :15 for post hospital    Contact information:   [address] [place] [address] [phone]       Follow up with PCP.   Contact information:   F/u with your PCP regarding DM, your HgbA1c was 7.2 in hospial and your blood suger was running high on insulin           Discharge  Instructions    Diet - low sodium heart healthy    Complete by:  As directed      Discharge instructions    Complete by:  As directed   No driving for 5days. No lifting over 5 lbs for 1 week. No sexual activity for 1 week. You may return to work on 05/14/15.  Keep procedure site clean & dry. If you notice increased pain, swelling, bleeding or pus, call/return!  You may shower, but no soaking baths/hot tubs/pools for 1 week.    Patients taking plavix and aspirin, should generally stay away from medicines like ibuprofen, Advil, Motrin, naproxen, and Aleve due to risk of stomach bleeding. You may take Tylenol as directed or talk to your primary doctor about alternatives.  Restart your metformin Sunday 05/09/15 - as you was taking prior     Increase activity slowly    Complete by:  As directed            F/u Labs/Studies: check LFT 6-8 weeks given change in statin.   Discharge Medications    Medication List    STOP taking these medications        meloxicam 15 MG tablet  Commonly known as:  MOBIC     VYTORIN 10-40 MG per tablet  Generic drug:  ezetimibe-simvastatin  Replaced by:  atorvastatin 40 MG tablet      TAKE these medications        amLODipine-benazepril 10-40 MG per capsule  Commonly known as:  LOTREL  Take 1 capsule by mouth daily.     aspirin 81 MG tablet  Take 81 mg by mouth daily.     atorvastatin 40 MG tablet  Commonly known as:  LIPITOR  Take 1 tablet (40 mg total) by mouth daily at 6 PM.     cholecalciferol 1000 UNITS tablet  Commonly known as:  VITAMIN D  Take 1,000-2,000 Units by mouth 2 (two) times daily. 2 tabs in the morning and 1 tab at night     clopidogrel 75 MG tablet  Commonly known as:  PLAVIX  Take 75 mg by mouth daily.     ezetimibe 10  MG tablet  Commonly known as:  ZETIA  Take 1 tablet (10 mg total) by mouth daily.     furosemide 20 MG tablet  Commonly known as:  LASIX  Take 1 tablet (20 mg total) by mouth daily.     isosorbide  mononitrate 30 MG 24 hr tablet  Commonly known as:  IMDUR  Take 1 tablet (30 mg total) by mouth daily.     ketorolac 0.5 % ophthalmic solution  Commonly known as:  ACULAR  Place 1 drop into the right eye 2 (two) times daily.     metFORMIN 500 MG tablet  Commonly known as:  GLUCOPHAGE  Take 500 mg by mouth 2 (two) times daily.     metoprolol succinate 100 MG 24 hr tablet  Commonly known as:  TOPROL-XL  Take 1 tablet by mouth daily.     NITROSTAT 0.4 MG SL tablet  Generic drug:  nitroGLYCERIN  Place 1 tablet under the tongue every 5 (five) minutes x 3 doses as needed.     pantoprazole 40 MG tablet  Commonly known as:  PROTONIX  Take 40 mg by mouth daily.     potassium chloride 10 MEQ tablet  Commonly known as:  K-DUR  Take 1 tablet (10 mEq total) by mouth daily.     prednisoLONE acetate 1 % ophthalmic suspension  Commonly known as:  PRED FORTE  Place 1 drop into the right eye every 2 (two) hours while awake.        Duration of Discharge Encounter   Greater than 30 minutes including physician time.  Signed, Bhagat,Bhavinkumar PA-C 05/07/2015, 1:26 PM    Personally seen and examined. Agree with above. Reassuring cath. Continue current meds with change to atorvastatin.  Candee Furbish, MD

## 2015-06-04 ENCOUNTER — Other Ambulatory Visit: Payer: Self-pay | Admitting: Physician Assistant

## 2015-06-09 ENCOUNTER — Ambulatory Visit: Payer: BLUE CROSS/BLUE SHIELD | Admitting: Cardiovascular Disease

## 2015-07-20 ENCOUNTER — Encounter: Payer: Self-pay | Admitting: Cardiovascular Disease

## 2015-07-20 ENCOUNTER — Ambulatory Visit (INDEPENDENT_AMBULATORY_CARE_PROVIDER_SITE_OTHER): Payer: BLUE CROSS/BLUE SHIELD | Admitting: Cardiovascular Disease

## 2015-07-20 VITALS — BP 154/80 | HR 86 | Ht 64.0 in | Wt 288.8 lb

## 2015-07-20 DIAGNOSIS — G4733 Obstructive sleep apnea (adult) (pediatric): Secondary | ICD-10-CM

## 2015-07-20 DIAGNOSIS — I2510 Atherosclerotic heart disease of native coronary artery without angina pectoris: Secondary | ICD-10-CM

## 2015-07-20 DIAGNOSIS — E785 Hyperlipidemia, unspecified: Secondary | ICD-10-CM

## 2015-07-20 DIAGNOSIS — I2583 Coronary atherosclerosis due to lipid rich plaque: Principal | ICD-10-CM

## 2015-07-20 DIAGNOSIS — I10 Essential (primary) hypertension: Secondary | ICD-10-CM

## 2015-07-20 NOTE — Assessment & Plan Note (Signed)
History of CAD status post proximal LAD stenting by Dr. [name] in 2006 with restudy April 2007 really a patent stent and otherwise no significant CAD and normal LV function. She had a negative Myoview 09/15/2009. She was recently hospitalized with unstable angina 8//16 underwent cardiac catheterization by Dr. Pernell Dupre revealing a patent proximal LAD stent, a jailed first diagonal branch with a 75% ostial stenosis and diffuse mid and distal LAD disease in the 50% range with normal LV function. She has had no recurrent chest pain.

## 2015-07-20 NOTE — Patient Instructions (Signed)
Medication Instructions:  Your physician recommends that you continue on your current medications as directed. Please refer to the Current Medication list given to you today.  Labwork: none  Testing/Procedures: none  Follow-Up: Your physician wants you to follow-up in: 12 months with Dr. Berry. You will receive a reminder letter in the mail two months in advance. If you don't receive a letter, please call our office to schedule the follow-up appointment.    Any Other Special Instructions Will Be Listed Below (If Applicable).   

## 2015-07-20 NOTE — Assessment & Plan Note (Addendum)
History of hyperlipidemia atorvastatin  40 mg a day and Zetia  with recent lipid profile performed 05/07/15 revealing total cholesterol 130, LDL 47 and HDL of 45

## 2015-07-20 NOTE — Assessment & Plan Note (Signed)
History of morbid obesity considering bariatric surgery

## 2015-07-20 NOTE — Progress Notes (Signed)
07/20/2015 Deborah Barber   1946-06-03  [id number]  Primary Physician Merian Capron, MD Primary Cardiologist: [name] MD [name]  HPI:  The patient is a 69 year old severely overweight divorced Caucasian female, mother of 59, grandmother to 2 grandchildren, who I last saw 65months ago. She did see [name] registered nurse practitioner in the office/8/15.She has a history of proximal LAD stenting by Dr. Janene Madeira in 2006 with restudy April 2007 revealing a patent stent, and otherwise no significant CAD and normal LV function. Her risk factors include non-insulin-requiring diabetes, hypertension, hyperlipidemia, and a strong family history for heart disease with a father who had an MI at age 71 and multiple MIs thereafter, ultimately leading to his demise. She does have obstructive sleep apnea, on CPAP.Myoview performed September 15, 2009, was low risk. Recent lipid profile performed during her recent hospitalization on 05/07/15 revealed a total cholesterol 1:30, LDL 47 and HDL of 45. She was hospitalized on 05/06/15 with unstable angina. She ruled out for myocardial infarction. She underwent cardiac catheterization by Dr. Tamala Julian revealing a patent LAD stent, jailed first diagonal branch with a 75% ostial stenosis and 50% segmental mid and distal LAD stenosis. Her circumflex and RCA were free of significant disease and she had normal LV function. She has had no recurrent symptoms.   Current Outpatient Prescriptions  Medication Sig Dispense Refill  . amLODipine-benazepril (LOTREL) 10-40 MG per capsule Take 1 capsule by mouth daily.     [name] aspirin 81 MG tablet Take 81 mg by mouth daily.    [name] atorvastatin (LIPITOR) 40 MG tablet TAKE 1 TABLET BY MOUTH DAILY AT 6PM 30 tablet 0  . cholecalciferol (VITAMIN D) 1000 UNITS tablet Take 1,000-2,000 Units by mouth 2 (two) times daily. 2 tabs in the morning and 1 tab at night    . clopidogrel (PLAVIX) 75 MG tablet Take 75 mg by mouth  daily.    . furosemide (LASIX) 20 MG tablet Take 1 tablet (20 mg total) by mouth daily. (Patient taking differently: Take 20 mg by mouth daily as needed for fluid. ) 30 tablet 6  . isosorbide mononitrate (IMDUR) 30 MG 24 hr tablet TAKE 1 TABLET BY MOUTH DAILY 30 tablet 0  . meloxicam (MOBIC) 15 MG tablet Take 15 mg by mouth daily.  2  . metFORMIN (GLUCOPHAGE) 500 MG tablet Take 500 mg by mouth 2 (two) times daily.    . metoprolol succinate (TOPROL-XL) 100 MG 24 hr tablet Take 1 tablet by mouth daily.    [name] NITROSTAT 0.4 MG SL tablet Place 1 tablet under the tongue every 5 (five) minutes x 3 doses as needed.    . pantoprazole (PROTONIX) 40 MG tablet Take 40 mg by mouth daily.    . potassium chloride (K-DUR) 10 MEQ tablet Take 1 tablet (10 mEq total) by mouth daily. (Patient taking differently: Take 10 mEq by mouth daily as needed (fluid). ) 30 tablet 6  . ZETIA 10 MG tablet TAKE 1 TABLET BY MOUTH DAILY 30 tablet 0   No current facility-administered medications for this visit.    Allergies  Allergen Reactions  . Oxycodone-Aspirin Nausea Only and Swelling    Other reaction(s): Vertigo  . Penicillins     Pt doesn't remember reaction  . Sulfa Antibiotics     Swelling, skin turned red  . Thiazide-Type Diuretics     Patient does not recall allergy to this. [facility].    Social History  Social History  . Marital Status: Divorced    Spouse Name: N/A  . Number of Children: N/A  . Years of Education: N/A   Occupational History  . Not on file.   Social History Main Topics  . Smoking status: Never Smoker   . Smokeless tobacco: Never Used  . Alcohol Use: No  . Drug Use: No  . Sexual Activity: No   Other Topics Concern  . Not on file   Social History Narrative     Review of Systems: General: negative for chills, fever, night sweats or weight changes.  Cardiovascular: negative for chest pain, dyspnea on exertion, edema, orthopnea, palpitations, paroxysmal  nocturnal dyspnea or shortness of breath Dermatological: negative for rash Respiratory: negative for cough or wheezing Urologic: negative for hematuria Abdominal: negative for nausea, vomiting, diarrhea, bright red blood per rectum, melena, or hematemesis Neurologic: negative for visual changes, syncope, or dizziness All other systems reviewed and are otherwise negative except as noted above.    Blood pressure 154/80, pulse 86, height 5\' 4"  (1.626 m), weight 288 lb 12.8 oz (130.999 kg).  General appearance: alert and no distress Neck: no adenopathy, no carotid bruit, no JVD, supple, symmetrical, trachea midline and thyroid not enlarged, symmetric, no tenderness/mass/nodules Lungs: clear to auscultation bilaterally Heart: regular rate and rhythm, S1, S2 normal, no murmur, click, rub or gallop Extremities: extremities normal, atraumatic, no cyanosis or edema  EKG not performed today  ASSESSMENT AND PLAN:   Coronary artery disease History of CAD status post proximal LAD stenting by Dr. [name] in 2006 with restudy April 2007 really a patent stent and otherwise no significant CAD and normal LV function. She had a negative Myoview 09/15/2009. She was recently hospitalized with unstable angina 8//16 underwent cardiac catheterization by Dr. Pernell Dupre revealing a patent proximal LAD stent, a jailed first diagonal branch with a 75% ostial stenosis and diffuse mid and distal LAD disease in the 50% range with normal LV function. She has had no recurrent chest pain.  Obstructive sleep apnea History of obstructive sleep apnea on C Pap which she benefits from  And wears intermittently  Morbid obesity ([place]) History of morbid obesity considering bariatric surgery  Hyperlipidemia History of hyperlipidemia atorvastatin  40 mg a day and Zetia  with recent lipid profile performed 05/07/15 revealing total cholesterol 130, LDL 47 and HDL of 45  Essential hypertension History of hypertension with blood  pressure today measured at 154/80. She is on amlodipine, benazepril and metoprolol. Continue current meds at current dosing      [name] MD [facility], [facility] 07/20/2015 8:17 AM

## 2015-07-20 NOTE — Assessment & Plan Note (Signed)
History of hypertension with blood pressure today measured at 154/80. She is on amlodipine, benazepril and metoprolol. Continue current meds at current dosing

## 2015-07-20 NOTE — Assessment & Plan Note (Signed)
History of obstructive sleep apnea on C Pap which she benefits from  And wears intermittently

## 2015-08-01 ENCOUNTER — Other Ambulatory Visit: Payer: Self-pay | Admitting: Cardiovascular Disease

## 2015-09-03 ENCOUNTER — Other Ambulatory Visit: Payer: Self-pay | Admitting: *Deleted

## 2015-09-03 MED ORDER — EZETIMIBE 10 MG PO TABS: 10.0000 mg | ORAL_TABLET | Freq: Every day | ORAL | Status: AC

## 2015-09-03 MED ORDER — ISOSORBIDE MONONITRATE ER 30 MG PO TB24: 30.0000 mg | ORAL_TABLET | Freq: Every day | ORAL | Status: DC

## 2015-09-03 MED ORDER — ATORVASTATIN CALCIUM 40 MG PO TABS: 40.0000 mg | ORAL_TABLET | Freq: Every day | ORAL | Status: AC

## 2016-05-29 ENCOUNTER — Telehealth: Payer: Self-pay | Admitting: Cardiovascular Disease

## 2016-05-29 NOTE — Telephone Encounter (Signed)
Received records from [facility] for appointment on 07/21/16 with Dr Gwenlyn Found.  Records given to [facility] (medical records) for Dr Kennon Holter schedule on 07/21/16. lp

## 2016-07-03 ENCOUNTER — Telehealth: Payer: Self-pay | Admitting: Cardiovascular Disease

## 2016-07-03 NOTE — Telephone Encounter (Signed)
Received Records from [facility] for appointment on 07/21/16 with Dr Gwenlyn Found.  Records given to [facility] (medical records) for Dr Kennon Holter schedule on 07/21/16. lp

## 2016-07-21 ENCOUNTER — Ambulatory Visit (INDEPENDENT_AMBULATORY_CARE_PROVIDER_SITE_OTHER): Payer: Medicare Other | Admitting: Cardiovascular Disease

## 2016-07-21 ENCOUNTER — Encounter: Payer: Self-pay | Admitting: Cardiovascular Disease

## 2016-07-21 DIAGNOSIS — E7800 Pure hypercholesterolemia, unspecified: Secondary | ICD-10-CM

## 2016-07-21 DIAGNOSIS — I2510 Atherosclerotic heart disease of native coronary artery without angina pectoris: Secondary | ICD-10-CM

## 2016-07-21 DIAGNOSIS — I10 Essential (primary) hypertension: Secondary | ICD-10-CM | POA: Diagnosis not present

## 2016-07-21 NOTE — Assessment & Plan Note (Signed)
History of hypertension on Lotrel with the recent addition of hydralazine. She is also on metoprolol. Since the addition of hydralazine and she said several episodes of syncope. The monitor showed no evidence of tachycardia or bradycardia arrhythmias. I suggested we stop the hydralazine and have her keep a blood pressure log over the next 4 weeks. She will see Deborah Barber in the office for follow-up

## 2016-07-21 NOTE — Progress Notes (Signed)
07/21/2016 Deborah Barber   09-04-1946  [id number]  Primary Physician Merian Capron, MD Primary Cardiologist: [name] MD [name]  HPI:  The patient is a 70 year old severely overweight divorced Caucasian female, mother of 32, grandmother to 2 grandchildren, who I last saw 75months ago. She did see [name] registered nurse practitioner in the office 07/20/15.She has a history of proximal LAD stenting by Dr. Janene Madeira in 2006 with restudy April 2007 revealing a patent stent, and otherwise no significant CAD and normal LV function. Her risk factors include non-insulin-requiring diabetes, hypertension, hyperlipidemia, and a strong family history for heart disease with a father who had an MI at age 16 and multiple MIs thereafter, ultimately leading to his demise. She does have obstructive sleep apnea, on CPAP.Myoview performed September 15, 2009, was low risk. Recent lipid profile performed during her recent hospitalization on 05/07/15 revealed a total cholesterol 1:30, LDL 47 and HDL of 45. She was hospitalized on 05/06/15 with unstable angina. She ruled out for myocardial infarction. She underwent cardiac catheterization by Dr. Tamala Julian revealing a patent LAD stent, jailed first diagonal branch with a 75% ostial stenosis and 50% segmental mid and distal LAD stenosis. Her circumflex and RCA were free of significant disease and she had normal LV function.  Since I saw her in the office here ago she has been well until recently. She did develop bilateral lower extremity edema and cellulitis requiring hospitalization and treatment with intravenous antibiotics. She also had the addition of hydralazine for hypertension and has had several subsequent syncopal episodes. An event monitor has showed no tachycardia or bradycardia arrhythmias.   Current Outpatient Prescriptions  Medication Sig Dispense Refill  . amLODipine-benazepril (LOTREL) 10-40 MG per capsule Take 1 capsule by mouth  daily.     [name] aspirin 81 MG tablet Take 81 mg by mouth daily.    [name] atorvastatin (LIPITOR) 40 MG tablet Take 1 tablet (40 mg total) by mouth daily at 6 PM. 90 tablet 3  . cephALEXin (KEFLEX) 250 MG capsule Take 1 capsule by mouth 4 (four) times daily.    . cholecalciferol (VITAMIN D) 1000 UNITS tablet Take 1,000-2,000 Units by mouth 2 (two) times daily. 2 tabs in the morning and 1 tab at night    . clopidogrel (PLAVIX) 75 MG tablet Take 75 mg by mouth daily.    [name] ezetimibe (ZETIA) 10 MG tablet Take 1 tablet (10 mg total) by mouth daily. 90 tablet 3  . furosemide (LASIX) 20 MG tablet Take 1 tablet (20 mg total) by mouth daily. (Patient taking differently: Take 20 mg by mouth daily as needed for fluid. ) 30 tablet 6  . gabapentin (NEURONTIN) 100 MG capsule Take 2 capsules by mouth 3 (three) times daily.  11  . hydrALAZINE (APRESOLINE) 25 MG tablet Take 25 mg by mouth every 8 (eight) hours.  11  . isosorbide mononitrate (IMDUR) 30 MG 24 hr tablet Take 1 tablet (30 mg total) by mouth daily. 90 tablet 3  . KLOR-CON M10 10 MEQ tablet Take 1 tablet by mouth daily.  3  . meloxicam (MOBIC) 15 MG tablet Take 15 mg by mouth daily.  2  . metFORMIN (GLUCOPHAGE) 500 MG tablet Take 500 mg by mouth 2 (two) times daily.    . metoprolol succinate (TOPROL-XL) 100 MG 24 hr tablet Take 1 tablet by mouth daily.    [name] NITROSTAT 0.4 MG SL tablet Place 1 tablet under the tongue every 5 (five)  minutes x 3 doses as needed.    . pantoprazole (PROTONIX) 40 MG tablet Take 40 mg by mouth daily.    . potassium chloride (K-DUR) 10 MEQ tablet Take 1 tablet (10 mEq total) by mouth daily. (Patient taking differently: Take 10 mEq by mouth daily as needed (fluid). ) 30 tablet 6  . triamcinolone cream (KENALOG) 0.1 % Apply 1 application topically 2 (two) times daily.  1   No current facility-administered medications for this visit.     Allergies  Allergen Reactions  . Oxycodone-Aspirin Nausea Only and Swelling    Other reaction(s):  Vertigo  . Penicillins Rash  . Sulfa Antibiotics Swelling    Swelling, skin turned red  . Thiazide-Type Diuretics Other (See Comments)    Patient does not recall allergy to this. [facility].    Social History   Social History  . Marital status: Divorced    Spouse name: N/A  . Number of children: N/A  . Years of education: N/A   Occupational History  . Not on file.   Social History Main Topics  . Smoking status: Never Smoker  . Smokeless tobacco: Never Used  . Alcohol use No  . Drug use: No  . Sexual activity: No   Other Topics Concern  . Not on file   Social History Narrative  . No narrative on file     Review of Systems: General: negative for chills, fever, night sweats or weight changes.  Cardiovascular: negative for chest pain, dyspnea on exertion, edema, orthopnea, palpitations, paroxysmal nocturnal dyspnea or shortness of breath Dermatological: negative for rash Respiratory: negative for cough or wheezing Urologic: negative for hematuria Abdominal: negative for nausea, vomiting, diarrhea, bright red blood per rectum, melena, or hematemesis Neurologic: negative for visual changes, syncope, or dizziness All other systems reviewed and are otherwise negative except as noted above.    Blood pressure 136/72, pulse 65, height 5' 3.5" (1.613 m), weight 282 lb 6.4 oz (128.1 kg).  General appearance: alert and no distress Neck: no adenopathy, no carotid bruit, no JVD, supple, symmetrical, trachea midline and thyroid not enlarged, symmetric, no tenderness/mass/nodules Lungs: clear to auscultation bilaterally Heart: regular rate and rhythm, S1, S2 normal, no murmur, click, rub or gallop Extremities: 1+ edema on the right, 2+ on the left with some mild erythema consistent with resolving cellulitis.  EKG normal sinus rhythm at 65, nonspecific ST and T-wave changes. I personally reviewed this EKG  ASSESSMENT AND PLAN:   Coronary artery disease History  of coronary artery disease status post LAD stenting by Dr. [name] February 2006. She was restudied April 2007 revealing a patent stent with no other stomach and CAD and normal LV function. Dr. Tamala Julian catheter in August 2016 revealing a patent LAD stent, gel first diagonal branch with 75% ostial stenosis, 50% segmental mid and distal LAD stenosis. Circumflex and RCA were free of significant disease and she had normal LV function at that time. She denies chest pain or shortness of breath.  Essential hypertension History of hypertension on Lotrel with the recent addition of hydralazine. She is also on metoprolol. Since the addition of hydralazine and she said several episodes of syncope. The monitor showed no evidence of tachycardia or bradycardia arrhythmias. I suggested we stop the hydralazine and have her keep a blood pressure log over the next 4 weeks. She will see Erasmo Downer in the office for follow-up  Hyperlipidemia History of hyperlipidemia on statin therapy followed by her PCP  [name] MD FACP,FACC,FAHA, [facility] 07/21/2016 10:41 AM

## 2016-07-21 NOTE — Assessment & Plan Note (Signed)
History of hyperlipidemia on statin therapy followed by her PCP. 

## 2016-07-21 NOTE — Patient Instructions (Signed)
Medication Instructions:  Your physician has recommended you make the following change in your medication:  1- STOP TAKING YOUR Hydralazine.   Labwork: Labwork will be requested from your primary care physician.   Testing/Procedures: none  Follow-Up: You have been referred to BLOOD PRESSURE CLINIC WITH PHARMACY IN 1 MONTH.  Your physician has requested that you regularly monitor and record your blood pressure readings at home. Please use the same machine at the same time of day to check your readings and record them to bring to your follow-up visit. PLEASE BRING YOUR BLOOD PRESSURE MACHINE WITH YOU TO YOUR APPT.   We request that you follow-up in: 6 MONTH with an extender and in 12 MONTHS with Dr Andria Rhein will receive a reminder letter in the mail two months in advance. If you don't receive a letter, please call our office to schedule the follow-up appointment.    Any Other Special Instructions Will Be Listed Below (If Applicable).     If you need a refill on your cardiac medications before your next appointment, please call your pharmacy.

## 2016-07-21 NOTE — Assessment & Plan Note (Signed)
History of coronary artery disease status post LAD stenting by Dr. [name] February 2006. She was restudied April 2007 revealing a patent stent with no other stomach and CAD and normal LV function. Dr. Tamala Julian catheter in August 2016 revealing a patent LAD stent, gel first diagonal branch with 75% ostial stenosis, 50% segmental mid and distal LAD stenosis. Circumflex and RCA were free of significant disease and she had normal LV function at that time. She denies chest pain or shortness of breath.

## 2016-08-21 ENCOUNTER — Ambulatory Visit (INDEPENDENT_AMBULATORY_CARE_PROVIDER_SITE_OTHER): Payer: Medicare Other | Admitting: Pharmacist

## 2016-08-21 VITALS — BP 128/64 | HR 60

## 2016-08-21 DIAGNOSIS — I10 Essential (primary) hypertension: Secondary | ICD-10-CM

## 2016-08-21 NOTE — Patient Instructions (Addendum)
Return for a follow up appointment in 3-4 weeks  Look at silver sneakers seated exercises  Please call [phone] with any questions or concerns.   Check your blood pressure at home daily (if able) and keep record of the readings.  Take your BP meds as follows: As prescribed  Start using your cuff on your upper arm to monitor.    Bring all of your meds, your BP cuff and your record of home blood pressures to your next appointment.  Exercise as you're able, try to walk approximately 30 minutes per day.  Keep salt intake to a minimum, especially watch canned and prepared boxed foods.  Eat more fresh fruits and vegetables and fewer canned items.  Avoid eating in fast food restaurants.    HOW TO TAKE YOUR BLOOD PRESSURE: . Rest 5 minutes before taking your blood pressure. .  Don't smoke or drink caffeinated beverages for at least 30 minutes before. . Take your blood pressure before (not after) you eat. . Sit comfortably with your back supported and both feet on the floor (don't cross your legs). . Elevate your arm to heart level on a table or a desk. . Use the proper sized cuff. It should fit smoothly and snugly around your bare upper arm. There should be enough room to slip a fingertip under the cuff. The bottom edge of the cuff should be 1 inch above the crease of the elbow. . Ideally, take 3 measurements at one sitting and record the average.

## 2016-08-21 NOTE — Progress Notes (Signed)
Patient ID: Deborah Barber                 DOB: October 30, 1945                      MRN: [id number]     HPI: Deborah Barber is a 70 y.o. female patient of Dr. Gwenlyn Found with [facility] who presents today for hypertension evaluation. Her hydralazine was recently discontinued due to syncopal episodes. She reports she has passed out twice. One of these times she feel and cracked her head requiring 6 staples.   She reports she was started on hydralazine during a hospital admission due to high blood pressures. She states that she was extremely dizzy after discharge and passed out about 4 days after discharge requiring another hospital admission at which time her hydralazine was decreased.   She reports after this admission she remained dizzy and had another spell and fell off a stool hitting her head and requiring the staples. She was seen about a week after this by Dr. Gwenlyn Found at which time her hydralazine was discontinued.   Of note she was started on the gabapentin around the same time as the hydralazine.   She reports that the "swimmy headedness" has not dramatically improved since stopping hydralazine, but she has not had another episode of passing out.   She also reports she is being monitored for problems with her thyroid and due for another level soon. She has not yet been started on medication for this.    Orthostatics checked today : 152/66 to 128/64 when standing   Cardiac Hx: CAD, HTN, OSA, DM2, morbid obesity  Current HTN meds:  Amlodipine-benazepril 10-40mg  daily Furosemide 20mg  PRN - has not really used Isosorbide mononitrate 30mg  daily Metoprolol succinate 100mg  daily   Previously tried:  Hydralazine - syncopal episodes  BP goal: <130/80  Family History: Her father had an MI at an early ago around 79YO.   Social History: She denies tobacco and alcohol.   Diet: She eats most of her on the go, but she states she has tried to be better about drive-thurs and has cut back some. She still  uses salt regularly. She endorses 1 cup of coffee per month, but at least 1-20oz coke per day.   Exercise: She is trying to increase her walking but currently cannot tolerate more than 30 minutes per day.   Home BP readings:  She reports she has been using her arm cuff on her wrist. It was unclear if this is because the cuff is too small for her arm, or because she was unsure how to appropriately place the cuff.   The log that she has reveals pressures as high as 191/97 down to 135/67. All of these readings were performed sitting.  Wt Readings from Last 3 Encounters:  07/21/16 282 lb 6.4 oz (128.1 kg)  07/20/15 288 lb 12.8 oz (131 kg)  05/07/15 297 lb 11.2 oz (135 kg)   BP Readings from Last 3 Encounters:  08/21/16 128/64  07/21/16 136/72  07/20/15 (!) 154/80   Pulse Readings from Last 3 Encounters:  08/21/16 60  07/21/16 65  07/20/15 86    Renal function: CrCl cannot be calculated (Patient's most recent lab result is older than the maximum 21 days allowed.).  Past Medical History:  Diagnosis Date  . Bleeding hemorrhoids   . Cancer of skin of back    a. Biopsy in 2016 pending removal 05/10/2015  . Cellulitis   .  Coronary artery disease    a. s/p LAD stenting by Dr. [name] in 2006. b. Prox to mid LAD lesion, 15% stenosed (previous stent), Ost 1st Diag lesion, 75% stenosed, and jailed by the LAD stent. Mid LAD to Dist LAD lesion, 50% stenosed. LV EF 55-60%, ED pressure >than 20 mmHg.   [name] GERD (gastroesophageal reflux disease)   . Hyperlipidemia   . Hypertension   . Morbid obesity ([place])    contemplating bariatric surgery  . Obstructive sleep apnea    on CPAP  . Type 2 diabetes mellitus ([place])     Current Outpatient Prescriptions on File Prior to Visit  Medication Sig Dispense Refill  . amLODipine-benazepril (LOTREL) 10-40 MG per capsule Take 1 capsule by mouth daily.     [name] aspirin 81 MG tablet Take 81 mg by mouth daily.    [name] atorvastatin (LIPITOR) 40 MG tablet Take 1  tablet (40 mg total) by mouth daily at 6 PM. 90 tablet 3  . cholecalciferol (VITAMIN D) 1000 UNITS tablet Take 1,000-2,000 Units by mouth 2 (two) times daily. 2 tabs in the morning and 1 tab at night    . clopidogrel (PLAVIX) 75 MG tablet Take 75 mg by mouth daily.    [name] ezetimibe (ZETIA) 10 MG tablet Take 1 tablet (10 mg total) by mouth daily. 90 tablet 3  . furosemide (LASIX) 20 MG tablet Take 1 tablet (20 mg total) by mouth daily. (Patient taking differently: Take 20 mg by mouth daily as needed for fluid. ) 30 tablet 6  . gabapentin (NEURONTIN) 100 MG capsule Take 2 capsules by mouth 3 (three) times daily.  11  . isosorbide mononitrate (IMDUR) 30 MG 24 hr tablet Take 1 tablet (30 mg total) by mouth daily. 90 tablet 3  . meloxicam (MOBIC) 15 MG tablet Take 15 mg by mouth daily.  2  . metFORMIN (GLUCOPHAGE) 500 MG tablet Take 500 mg by mouth 2 (two) times daily.    . metoprolol succinate (TOPROL-XL) 100 MG 24 hr tablet Take 1 tablet by mouth daily.    . pantoprazole (PROTONIX) 40 MG tablet Take 40 mg by mouth daily.    . potassium chloride (K-DUR) 10 MEQ tablet Take 1 tablet (10 mEq total) by mouth daily. (Patient taking differently: Take 10 mEq by mouth daily as needed (fluid). ) 30 tablet 6  . triamcinolone cream (KENALOG) 0.1 % Apply 1 application topically daily.   1  . NITROSTAT 0.4 MG SL tablet Place 1 tablet under the tongue every 5 (five) minutes x 3 doses as needed.     No current facility-administered medications on file prior to visit.     Allergies  Allergen Reactions  . Oxycodone-Aspirin Nausea Only and Swelling    Other reaction(s): Vertigo  . Penicillins Rash  . Sulfa Antibiotics Swelling    Swelling, skin turned red  . Thiazide-Type Diuretics Other (See Comments)    Patient does not recall allergy to this. [facility].    Blood pressure 128/64, pulse 60, SpO2 98 %.   Assessment/Plan: Hypertension:  Her sitting BP is elevated today; but she is positive  for orthostatics. Her pressure dropped 38mmHg upon standing and I think this could be contributing to her passing out. Will have her continue to monitor and bring her cuff for verification at next visit. At this point I will not reinitiate hydralazine for the elevated pressures due to orthostatics and history of passing out. If this continues we may need  to taper her other blood pressure medications as well. Follow up in hypertension clinic in 3-4 weeks.    Thank you, Lelan Pons. Patterson Hammersmith, [facility]  08/22/2016 9:32 PM

## 2016-08-22 ENCOUNTER — Encounter: Payer: Self-pay | Admitting: Pharmacist

## 2016-09-14 ENCOUNTER — Ambulatory Visit: Payer: Medicare Other

## 2016-10-10 ENCOUNTER — Ambulatory Visit: Payer: Medicare Other

## 2016-11-14 ENCOUNTER — Ambulatory Visit: Payer: Medicare Other

## 2017-06-01 ENCOUNTER — Telehealth: Payer: Self-pay | Admitting: Cardiovascular Disease

## 2017-06-01 NOTE — Telephone Encounter (Signed)
Received incoming records from [facility] for upcoming appointment on 07/18/17 @ 10:45am with Dr. Gwenlyn Found. Records given to [facility] in Medical Records. 06/01/17/ab

## 2017-07-18 ENCOUNTER — Ambulatory Visit (INDEPENDENT_AMBULATORY_CARE_PROVIDER_SITE_OTHER): Payer: Medicare Other | Admitting: Cardiovascular Disease

## 2017-07-18 ENCOUNTER — Encounter: Payer: Self-pay | Admitting: Cardiovascular Disease

## 2017-07-18 VITALS — BP 184/78 | HR 91 | Ht 64.0 in | Wt 308.8 lb

## 2017-07-18 DIAGNOSIS — E7800 Pure hypercholesterolemia, unspecified: Secondary | ICD-10-CM | POA: Diagnosis not present

## 2017-07-18 DIAGNOSIS — R079 Chest pain, unspecified: Secondary | ICD-10-CM | POA: Diagnosis not present

## 2017-07-18 DIAGNOSIS — I10 Essential (primary) hypertension: Secondary | ICD-10-CM | POA: Diagnosis not present

## 2017-07-18 MED ORDER — LOSARTAN POTASSIUM 100 MG PO TABS: 100.0000 mg | ORAL_TABLET | Freq: Every day | ORAL | 3 refills | Status: DC

## 2017-07-18 MED ORDER — AMLODIPINE BESYLATE 10 MG PO TABS: 10.0000 mg | ORAL_TABLET | Freq: Every day | ORAL | 3 refills | Status: DC

## 2017-07-18 MED ORDER — HYDRALAZINE HCL 25 MG PO TABS: 25.0000 mg | ORAL_TABLET | Freq: Three times a day (TID) | ORAL | 3 refills | Status: DC

## 2017-07-18 NOTE — Assessment & Plan Note (Signed)
History of hyperlipidemia and Zetia and statin therapy followed by her PCP.

## 2017-07-18 NOTE — Addendum Note (Signed)
Addended by: Therisa Doyne on: 07/18/2017 11:29 AM   Modules accepted: Orders

## 2017-07-18 NOTE — Assessment & Plan Note (Signed)
History of essential hypertension blood pressure measured 184/78. She is on amlodipine, benazepril, and high-dose metoprolol. I am going to add hydralazine 25 mg by mouth 3 times a day. She will check her blood pressure daily and will see Erasmo Downer back in the office in 4 weeks for review and titration.

## 2017-07-18 NOTE — Progress Notes (Signed)
07/18/2017 Deborah Barber   1945/12/17  [id number]  Primary Physician Merian Capron, MD Primary Cardiologist: [name] MD Garret Reddish, [place], [place]  HPI:  Deborah Barber is a 71 y.o. female severely overweight divorced Caucasian female, mother of 81, grandmother to 2 grandchildren, who I last saw in the office 07/21/16. She is accompanied by her daughter came today.[name] Marland KitchenShe has a history of proximal LAD stenting by Dr. Janene Madeira in 2006 with restudy April 2007 revealing a patent stent, and otherwise no significant CAD and normal LV function. Her risk factors include non-insulin-requiring diabetes, hypertension, hyperlipidemia, and a strong family history for heart disease with a father who had an MI at age 59 and multiple MIs thereafter, ultimately leading to his demise. She does have obstructive sleep apnea, on CPAP.Myoview performed September 15, 2009, was low risk. Recent lipid profile performed during her recent hospitalization on 05/07/15 revealed a total cholesterol 1:30, LDL 47 and HDL of 45. She was hospitalized on 05/06/15 with unstable angina. She ruled out for myocardial infarction. She underwent cardiac catheterization by Dr. Tamala Julian revealing a patent LAD stent, jailed first diagonal branch with a 75% ostial stenosis and 50% segmental mid and distal LAD stenosis. Her circumflex and RCA were free of significant disease and she had normal LV function.  Since I saw her in the office here ago she has been well until recently. She did develop bilateral lower extremity edema and cellulitis requiring hospitalization and treatment with intravenous antibiotics. She also had the addition of hydralazine for hypertension and has had several subsequent syncopal episodes. An event monitor has showed no tachycardia or bradycardia arrhythmias. Since I saw her in the office year ago she's remained fairly stable although she has retired and gained 25 pounds. She does complete of effort angina. Her Imdur was  recently uptitrated by her PCP. Blood pressures have trended higher as well.  Current Meds  Medication Sig  . amLODipine-benazepril (LOTREL) 10-40 MG per capsule Take 1 capsule by mouth daily.   [name] aspirin 81 MG tablet Take 81 mg by mouth daily.  [name] atorvastatin (LIPITOR) 40 MG tablet Take 1 tablet (40 mg total) by mouth daily at 6 PM.  . cholecalciferol (VITAMIN D) 1000 UNITS tablet Take 1,000-2,000 Units by mouth 2 (two) times daily. 2 tabs in the morning and 1 tab at night  . clopidogrel (PLAVIX) 75 MG tablet Take 75 mg by mouth daily.  [name] ezetimibe (ZETIA) 10 MG tablet Take 1 tablet (10 mg total) by mouth daily.  . furosemide (LASIX) 20 MG tablet Take 20 mg by mouth as directed.  . gabapentin (NEURONTIN) 100 MG capsule Take 2 capsules by mouth 3 (three) times daily.  . isosorbide mononitrate (IMDUR) 60 MG 24 hr tablet Take 60 mg by mouth daily.  . meloxicam (MOBIC) 15 MG tablet Take 15 mg by mouth daily.  . metFORMIN (GLUCOPHAGE) 500 MG tablet Take 500 mg by mouth 2 (two) times daily.  . metoprolol (TOPROL-XL) 200 MG 24 hr tablet Take 200 mg by mouth daily.  [name] NITROSTAT 0.4 MG SL tablet Place 1 tablet under the tongue every 5 (five) minutes x 3 doses as needed.  . pantoprazole (PROTONIX) 40 MG tablet Take 40 mg by mouth daily.  . potassium chloride (K-DUR) 10 MEQ tablet Take 1 tablet (10 mEq total) by mouth daily. (Patient taking differently: Take 10 mEq by mouth daily as needed (fluid). )  . triamcinolone cream (KENALOG) 0.1 % Apply 1 application topically  daily.      Allergies  Allergen Reactions  . Hydralazine Other (See Comments)    Faints when using this medication  . Oxycodone-Aspirin Nausea Only and Swelling    Other reaction(s): Vertigo  . Penicillins Rash  . Sulfa Antibiotics Swelling    Swelling, skin turned red  . Thiazide-Type Diuretics Other (See Comments)    Patient does not recall allergy to this. [facility].    Social History   Social History  .  Marital status: Divorced    Spouse name: N/A  . Number of children: N/A  . Years of education: N/A   Occupational History  . Not on file.   Social History Main Topics  . Smoking status: Never Smoker  . Smokeless tobacco: Never Used  . Alcohol use No  . Drug use: No  . Sexual activity: No   Other Topics Concern  . Not on file   Social History Narrative  . No narrative on file     Review of Systems: General: negative for chills, fever, night sweats or weight changes.  Cardiovascular: negative for chest pain, dyspnea on exertion, edema, orthopnea, palpitations, paroxysmal nocturnal dyspnea or shortness of breath Dermatological: negative for rash Respiratory: negative for cough or wheezing Urologic: negative for hematuria Abdominal: negative for nausea, vomiting, diarrhea, bright red blood per rectum, melena, or hematemesis Neurologic: negative for visual changes, syncope, or dizziness All other systems reviewed and are otherwise negative except as noted above.    Blood pressure (!) 184/78, pulse 91, height 5\' 4"  (1.626 m), weight (!) 308 lb 12.8 oz (140.1 kg).  General appearance: alert and no distress Neck: no adenopathy, no carotid bruit, no JVD, supple, symmetrical, trachea midline and thyroid not enlarged, symmetric, no tenderness/mass/nodules Lungs: clear to auscultation bilaterally Heart: regular rate and rhythm, S1, S2 normal, no murmur, click, rub or gallop Extremities: extremities normal, atraumatic, no cyanosis or edema Pulses: 2+ and symmetric Skin: Skin color, texture, turgor normal. No rashes or lesions Neurologic: Alert and oriented X 3, normal strength and tone. Normal symmetric reflexes. Normal coordination and gait  EKG sinus rhythm at 91 with nonspecific ST and T-wave changes. I Personally reviewed this EKG.  ASSESSMENT AND PLAN:   Coronary artery disease History of CAD status post LAD stenting by Dr. [name] in 2006 with restudy in 2017 revealing patent  stent and otherwise no significant CAD with normal LV function. She did undergo catheterization by Dr. Tamala Julian 05/06/15 in the setting of unstable angina revealing a patent LAD stent with a jailed first diagonal branch has 75% ostial stenosis of 50% segmental mid and distal LAD disease. She does complain of some effort angina despite being on high-dose or acting oral nitrates. I am going to get a from a cardiac Myoview stress test to further evaluate.  Essential hypertension History of essential hypertension blood pressure measured 184/78. She is on amlodipine, benazepril, and high-dose metoprolol. I am going to add hydralazine 25 mg by mouth 3 times a day. She will check her blood pressure daily and will see Erasmo Downer back in the office in 4 weeks for review and titration.  Hyperlipidemia History of hyperlipidemia and Zetia and statin therapy followed by her PCP.      [name] MD FACP,FACC,FAHA, [facility] 07/18/2017 11:12 AM

## 2017-07-18 NOTE — Patient Instructions (Addendum)
Medication Instructions: STOP Lotrel  START Amlodipine 10 mg daily  START Losartan 100 mg daily.    Testing/Procedures: Your physician has requested that you have a lexiscan myoview. For further information please visit [email or web address]. Please follow instruction sheet, as given.  Follow-Up: We request that you follow-up in: 1 month with PharmD for blood pressure and in 3 months with Dr Andria Rhein will receive a reminder letter in the mail two months in advance. If you don't receive a letter, please call our office to schedule the follow-up appointment.  If you need a refill on your cardiac medications before your next appointment, please call your pharmacy.

## 2017-07-18 NOTE — Addendum Note (Signed)
Addended by: Therisa Doyne on: 07/18/2017 11:23 AM   Modules accepted: Orders

## 2017-07-18 NOTE — Addendum Note (Signed)
Addended by: Zebedee Iba on: 07/18/2017 02:03 PM   Modules accepted: Orders

## 2017-07-18 NOTE — Assessment & Plan Note (Signed)
History of CAD status post LAD stenting by Dr. [name] in 2006 with restudy in 2017 revealing patent stent and otherwise no significant CAD with normal LV function. She did undergo catheterization by Dr. Tamala Julian 05/06/15 in the setting of unstable angina revealing a patent LAD stent with a jailed first diagonal branch has 75% ostial stenosis of 50% segmental mid and distal LAD disease. She does complain of some effort angina despite being on high-dose or acting oral nitrates. I am going to get a from a cardiac Myoview stress test to further evaluate.

## 2017-07-19 ENCOUNTER — Telehealth (HOSPITAL_COMMUNITY): Payer: Self-pay

## 2017-07-19 NOTE — Telephone Encounter (Signed)
Encounter complete. 

## 2017-07-20 ENCOUNTER — Telehealth (HOSPITAL_COMMUNITY): Payer: Self-pay

## 2017-07-20 NOTE — Telephone Encounter (Signed)
Encounter complete. 

## 2017-07-24 ENCOUNTER — Ambulatory Visit (HOSPITAL_COMMUNITY)
Admission: RE | Admit: 2017-07-24 | Discharge: 2017-07-24 | Disposition: A | Discharge status: Home / Self Care | Payer: Medicare Other | Source: Ambulatory Visit | Attending: Cardiovascular Disease | Admitting: Cardiovascular Disease

## 2017-07-24 DIAGNOSIS — E079 Disorder of thyroid, unspecified: Secondary | ICD-10-CM | POA: Diagnosis not present

## 2017-07-24 DIAGNOSIS — R0789 Other chest pain: Secondary | ICD-10-CM | POA: Diagnosis not present

## 2017-07-24 DIAGNOSIS — Z6841 Body Mass Index (BMI) 40.0 and over, adult: Secondary | ICD-10-CM | POA: Insufficient documentation

## 2017-07-24 DIAGNOSIS — R42 Dizziness and giddiness: Secondary | ICD-10-CM | POA: Diagnosis not present

## 2017-07-24 DIAGNOSIS — G4733 Obstructive sleep apnea (adult) (pediatric): Secondary | ICD-10-CM | POA: Diagnosis not present

## 2017-07-24 DIAGNOSIS — Z8249 Family history of ischemic heart disease and other diseases of the circulatory system: Secondary | ICD-10-CM | POA: Insufficient documentation

## 2017-07-24 DIAGNOSIS — N183 Chronic kidney disease, stage 3 (moderate): Secondary | ICD-10-CM | POA: Insufficient documentation

## 2017-07-24 DIAGNOSIS — R0609 Other forms of dyspnea: Secondary | ICD-10-CM | POA: Diagnosis not present

## 2017-07-24 DIAGNOSIS — R079 Chest pain, unspecified: Secondary | ICD-10-CM | POA: Diagnosis not present

## 2017-07-24 DIAGNOSIS — I1310 Hypertensive heart and chronic kidney disease without heart failure, with stage 1 through stage 4 chronic kidney disease, or unspecified chronic kidney disease: Secondary | ICD-10-CM | POA: Insufficient documentation

## 2017-07-24 DIAGNOSIS — E1122 Type 2 diabetes mellitus with diabetic chronic kidney disease: Secondary | ICD-10-CM | POA: Insufficient documentation

## 2017-07-24 DIAGNOSIS — I2510 Atherosclerotic heart disease of native coronary artery without angina pectoris: Secondary | ICD-10-CM | POA: Diagnosis not present

## 2017-07-24 DIAGNOSIS — E785 Hyperlipidemia, unspecified: Secondary | ICD-10-CM | POA: Diagnosis not present

## 2017-07-24 DIAGNOSIS — R55 Syncope and collapse: Secondary | ICD-10-CM | POA: Insufficient documentation

## 2017-07-24 MED ORDER — AMINOPHYLLINE 25 MG/ML IV SOLN
75.0000 mg | Freq: Once | INTRAVENOUS | Status: AC
  Administered 2017-07-24: 75 mg via INTRAVENOUS

## 2017-07-24 MED ORDER — REGADENOSON 0.4 MG/5ML IV SOLN
0.4000 mg | Freq: Once | INTRAVENOUS | Status: AC
  Administered 2017-07-24: 0.4 mg via INTRAVENOUS

## 2017-07-24 MED ORDER — TECHNETIUM TC 99M TETROFOSMIN IV KIT
28.0000 | PACK | Freq: Once | INTRAVENOUS | Status: AC | PRN
  Administered 2017-07-24: 28 via INTRAVENOUS
  Filled 2017-07-24: qty 28

## 2017-07-25 ENCOUNTER — Ambulatory Visit (HOSPITAL_COMMUNITY)
Admission: RE | Admit: 2017-07-25 | Discharge: 2017-07-25 | Disposition: A | Discharge status: Home / Self Care | Payer: Medicare Other | Source: Ambulatory Visit | Attending: Cardiology | Admitting: Cardiology

## 2017-07-25 LAB — MYOCARDIAL PERFUSION IMAGING
CHL CUP NUCLEAR SDS: 3
CHL CUP NUCLEAR SSS: 3
LV sys vol: 24 mL
LVDIAVOL: 65 mL (ref 46–106)
NUC STRESS TID: 0.79
Peak HR: 83 {beats}/min
Rest HR: 67 {beats}/min
SRS: 0

## 2017-07-25 MED ORDER — TECHNETIUM TC 99M TETROFOSMIN IV KIT
30.1000 | PACK | Freq: Once | INTRAVENOUS | Status: AC | PRN
  Administered 2017-07-25: 30.1 via INTRAVENOUS

## 2017-08-16 ENCOUNTER — Ambulatory Visit (INDEPENDENT_AMBULATORY_CARE_PROVIDER_SITE_OTHER): Payer: Medicare Other | Admitting: Pharmacist Clinician (PhC)/ Clinical Pharmacy Specialist

## 2017-08-16 DIAGNOSIS — I10 Essential (primary) hypertension: Secondary | ICD-10-CM

## 2017-08-16 NOTE — Patient Instructions (Signed)
Return for a a follow up appointment in 1 month  Your blood pressure today is 126/78  (goal is < 130/80)  Check your blood pressure at home no more than twice daily and keep record of the readings.  Try cutting back on soda to no more than 3 times per week  Take your BP meds as follows:  Switch metoprolol to bedtime  Continue with all other medications  Bring all of your meds, your BP cuff and your record of home blood pressures to your next appointment.  Exercise as you're able, try to walk approximately 30 minutes per day.  Keep salt intake to a minimum, especially watch canned and prepared boxed foods.  Eat more fresh fruits and vegetables and fewer canned items.  Avoid eating in fast food restaurants.    HOW TO TAKE YOUR BLOOD PRESSURE: . Rest 5 minutes before taking your blood pressure. .  Don't smoke or drink caffeinated beverages for at least 30 minutes before. . Take your blood pressure before (not after) you eat. . Sit comfortably with your back supported and both feet on the floor (don't cross your legs). . Elevate your arm to heart level on a table or a desk. . Use the proper sized cuff. It should fit smoothly and snugly around your bare upper arm. There should be enough room to slip a fingertip under the cuff. The bottom edge of the cuff should be 1 inch above the crease of the elbow. . Ideally, take 3 measurements at one sitting and record the average.

## 2017-08-16 NOTE — Progress Notes (Signed)
08/16/2017 Shivaun Bilello [birth year] [id number]   HPI:  Deborah Barber is a 71 y.o. female patient of Dr Gwenlyn Found, with a PMH below who presents today for hypertension clinic evaluation.  Her cardiac history is significant for CAD with proximal LAD stentin in 2006, DM2, HTN, hyperlipidemia, OSA on CPAP (even when napping) and a strong family history of CAD.  A hospitalization in 2016 for unstable angina was ruled out for MI, however was found to have a 75% stenosis in the ostial LAD as well as 50% in mid and distal LAD  Patient takes all BP meds in am (noon).  She is hoping to have gastric bypass surgery in 2019, has been cleared by Dr. Gwenlyn Found.  She needs to have knee replacement, but orthopedics won't do this until she loses 100 pounds.  She has consistently weighted over 300 pounds for the last 5-6 years, but notes her BP has been uncontrolled for about 2 years.  Blood Pressure Goal:  130/80  Current Medications:  Amlodipine 10 mg qd - noon  Losartan 100 mg qd -noon  Metoprolol succ 200 mg qd - noon  Furosemide 20 mg prn  Isosorbide mono 60 mg qd - noon  Family Hx:  Father with multiple MI's, first at age 34, deceased by 81  Mother with hypertension, heart murmur, deceased this past 2022/12/20 at 72  1 brother with no known heart disease  1 daughter with hypertension, started before age 44  Social Hx:  No tobacco, no alcohol, Coke daily, 32 oz   Diet:  Looking into gastric bypass surgery for 2019, needs to lose 100 lb to get knee repalced, wants knee replaced  Has been trying to cut back on sodium, states Dr. Gwenlyn Found told her "No Salt!"  Does admit to liking [employer]   chicken nugget happy meals  Exercise:  None due to knee problems  Home BP readings:  Home cuff, uses on forearm, brand unknown;  Not compared in MD office   Intolerances:   Hydralazine caused dizziness/syncope   [facility] th[facility] allergy, but patient has no knowledge of this  Labs:   Na 138, K 4.3, Glu  144, BUN 12, SCr 1.20  Wt Readings from Last 3 Encounters:  07/24/17 (!) 308 lb (139.7 kg)  07/18/17 (!) 308 lb 12.8 oz (140.1 kg)  07/21/16 282 lb 6.4 oz (128.1 kg)   BP Readings from Last 3 Encounters:  08/16/17 126/78  07/18/17 (!) 184/78  08/21/16 128/64   Pulse Readings from Last 3 Encounters:  08/16/17 76  07/18/17 91  08/21/16 60    Current Outpatient Medications  Medication Sig Dispense Refill  . amLODipine (NORVASC) 10 MG tablet Take 1 tablet (10 mg total) by mouth daily. 30 tablet 3  . aspirin 81 MG tablet Take 81 mg by mouth daily.    [name] atorvastatin (LIPITOR) 40 MG tablet Take 1 tablet (40 mg total) by mouth daily at 6 PM. 90 tablet 3  . cholecalciferol (VITAMIN D) 1000 UNITS tablet Take 1,000-2,000 Units by mouth 2 (two) times daily. 2 tabs in the morning and 1 tab at night    . clopidogrel (PLAVIX) 75 MG tablet Take 75 mg by mouth daily.    [name] ezetimibe (ZETIA) 10 MG tablet Take 1 tablet (10 mg total) by mouth daily. 90 tablet 3  . furosemide (LASIX) 20 MG tablet Take 20 mg by mouth as directed.    . gabapentin (NEURONTIN) 100 MG capsule Take 2 capsules by  mouth 3 (three) times daily.  11  . isosorbide mononitrate (IMDUR) 60 MG 24 hr tablet Take 60 mg by mouth daily.    [name] losartan (COZAAR) 100 MG tablet Take 1 tablet (100 mg total) by mouth daily. 30 tablet 3  . meloxicam (MOBIC) 15 MG tablet Take 15 mg by mouth daily.  2  . metFORMIN (GLUCOPHAGE) 500 MG tablet Take 500 mg by mouth 2 (two) times daily.    . metoprolol (TOPROL-XL) 200 MG 24 hr tablet Take 200 mg by mouth daily.    [name] NITROSTAT 0.4 MG SL tablet Place 1 tablet under the tongue every 5 (five) minutes x 3 doses as needed.    . pantoprazole (PROTONIX) 40 MG tablet Take 40 mg by mouth daily.    . potassium chloride (K-DUR) 10 MEQ tablet Take 1 tablet (10 mEq total) by mouth daily. (Patient taking differently: Take 10 mEq by mouth daily as needed (fluid). ) 30 tablet 6  . triamcinolone cream (KENALOG) 0.1 %  Apply 1 application topically daily.   1   No current facility-administered medications for this visit.     Allergies  Allergen Reactions  . Hydralazine Other (See Comments)    Faints when using this medication  . Oxycodone-Aspirin Nausea Only and Swelling    Other reaction(s): Vertigo  . Penicillins Rash  . Sulfa Antibiotics Swelling    Swelling, skin turned red  . Thiazide-Type Diuretics Other (See Comments)    Patient does not recall allergy to this. [facility].    Past Medical History:  Diagnosis Date  . Bleeding hemorrhoids   . Cancer of skin of back    a. Biopsy in 2016 pending removal 05/10/2015  . Cellulitis   . Coronary artery disease    a. s/p LAD stenting by Dr. [name] in 2006. b. Prox to mid LAD lesion, 15% stenosed (previous stent), Ost 1st Diag lesion, 75% stenosed, and jailed by the LAD stent. Mid LAD to Dist LAD lesion, 50% stenosed. LV EF 55-60%, ED pressure >than 20 mmHg.   [name] GERD (gastroesophageal reflux disease)   . Hyperlipidemia   . Hypertension   . Morbid obesity ([place])    contemplating bariatric surgery  . Obstructive sleep apnea    on CPAP  . Type 2 diabetes mellitus ([facility])     Blood pressure 126/78, pulse 76.  Essential hypertension Patient with essential hypertension and home readings averaging 025'E systolic.  In the office today she read 126/78 on two separate readings.   For now I am going to have her switch her metoprolol to evenings and have her check home BP readings only twice daily at set times (after getting up around noon and close to bedtime).   She will return in one month with her list of readings and her home cuff for verification.     Tommy Medal PharmD CPP [facility] [address] [place], [place] [address] [phone]

## 2017-08-16 NOTE — Assessment & Plan Note (Signed)
Patient with essential hypertension and home readings averaging 403'J systolic.  In the office today she read 126/78 on two separate readings.   For now I am going to have her switch her metoprolol to evenings and have her check home BP readings only twice daily at set times (after getting up around noon and close to bedtime).   She will return in one month with her list of readings and her home cuff for verification.

## 2017-09-13 ENCOUNTER — Ambulatory Visit: Payer: Medicare Other

## 2017-10-01 DIAGNOSIS — C44509 Unspecified malignant neoplasm of skin of other part of trunk: Secondary | ICD-10-CM | POA: Insufficient documentation

## 2017-10-01 DIAGNOSIS — K649 Unspecified hemorrhoids: Secondary | ICD-10-CM | POA: Insufficient documentation

## 2017-10-01 DIAGNOSIS — L0390 Cellulitis, unspecified: Secondary | ICD-10-CM | POA: Insufficient documentation

## 2017-10-01 DIAGNOSIS — K219 Gastro-esophageal reflux disease without esophagitis: Secondary | ICD-10-CM | POA: Insufficient documentation

## 2017-10-01 DIAGNOSIS — I10 Essential (primary) hypertension: Secondary | ICD-10-CM | POA: Insufficient documentation

## 2017-10-10 ENCOUNTER — Ambulatory Visit: Payer: Medicare Other

## 2017-10-19 ENCOUNTER — Ambulatory Visit: Payer: Medicare Other | Admitting: Cardiovascular Disease

## 2017-11-06 ENCOUNTER — Other Ambulatory Visit: Payer: Self-pay | Admitting: Cardiovascular Disease

## 2017-11-17 ENCOUNTER — Other Ambulatory Visit: Payer: Self-pay | Admitting: Cardiovascular Disease

## 2017-11-21 ENCOUNTER — Ambulatory Visit: Payer: Medicare Other

## 2017-12-04 ENCOUNTER — Ambulatory Visit: Payer: Medicare Other | Admitting: Cardiovascular Disease

## 2018-01-30 DEATH — deceased

## 2020-01-09 ENCOUNTER — Other Ambulatory Visit: Payer: Self-pay | Admitting: Family
